# Patient Record
Sex: Female | Born: 1950 | Race: White | Hispanic: No | Marital: Married | Smoking: Never smoker
Health system: Southern US, Community
[De-identification: ages and names within clinical notes are randomized; demographics above are authoritative.]

## PROBLEM LIST (undated history)

## (undated) DIAGNOSIS — I10 Essential (primary) hypertension: Secondary | ICD-10-CM

## (undated) DIAGNOSIS — K219 Gastro-esophageal reflux disease without esophagitis: Secondary | ICD-10-CM

## (undated) HISTORY — PX: SPINE SURGERY: SHX786

## (undated) HISTORY — PX: ABDOMINAL HYSTERECTOMY: SHX81

## (undated) HISTORY — DX: Gastro-esophageal reflux disease without esophagitis: K21.9

## (undated) HISTORY — PX: BREAST BIOPSY: SHX20

## (undated) HISTORY — PX: CERVICAL CONE BIOPSY: SUR198

## (undated) HISTORY — PX: TONSILLECTOMY: SUR1361

## (undated) HISTORY — PX: CERVICAL FUSION: SHX112

## (undated) HISTORY — DX: Essential (primary) hypertension: I10

---

## 1999-07-01 ENCOUNTER — Other Ambulatory Visit: Admission: RE | Admit: 1999-07-01 | Discharge: 1999-07-01 | Payer: Self-pay | Admitting: Obstetrics and Gynecology

## 1999-07-03 ENCOUNTER — Encounter: Payer: Self-pay | Admitting: Obstetrics and Gynecology

## 1999-07-03 ENCOUNTER — Encounter: Admission: RE | Admit: 1999-07-03 | Discharge: 1999-07-03 | Payer: Self-pay | Admitting: Obstetrics and Gynecology

## 2000-07-26 ENCOUNTER — Other Ambulatory Visit: Admission: RE | Admit: 2000-07-26 | Discharge: 2000-07-26 | Payer: Self-pay | Admitting: Obstetrics and Gynecology

## 2003-04-26 ENCOUNTER — Emergency Department (HOSPITAL_COMMUNITY): Admission: EM | Admit: 2003-04-26 | Discharge: 2003-04-26 | Payer: Self-pay | Admitting: Emergency Medicine

## 2003-04-26 ENCOUNTER — Encounter: Payer: Self-pay | Admitting: Emergency Medicine

## 2004-09-08 ENCOUNTER — Ambulatory Visit: Payer: Self-pay | Admitting: Internal Medicine

## 2004-10-10 ENCOUNTER — Ambulatory Visit: Payer: Self-pay | Admitting: Internal Medicine

## 2004-11-28 ENCOUNTER — Encounter: Admission: RE | Admit: 2004-11-28 | Discharge: 2004-11-28 | Payer: Self-pay | Admitting: Gastroenterology

## 2005-02-18 ENCOUNTER — Ambulatory Visit (HOSPITAL_COMMUNITY): Admission: RE | Admit: 2005-02-18 | Discharge: 2005-02-18 | Payer: Self-pay | Admitting: Gastroenterology

## 2005-04-03 ENCOUNTER — Ambulatory Visit: Payer: Self-pay | Admitting: Internal Medicine

## 2005-07-20 ENCOUNTER — Ambulatory Visit: Payer: Self-pay | Admitting: Internal Medicine

## 2005-07-24 ENCOUNTER — Ambulatory Visit: Payer: Self-pay | Admitting: Internal Medicine

## 2005-07-29 ENCOUNTER — Ambulatory Visit: Payer: Self-pay | Admitting: *Deleted

## 2005-09-29 ENCOUNTER — Ambulatory Visit: Payer: Self-pay | Admitting: Internal Medicine

## 2006-03-19 ENCOUNTER — Ambulatory Visit: Payer: Self-pay | Admitting: Internal Medicine

## 2006-07-05 ENCOUNTER — Ambulatory Visit: Payer: Self-pay | Admitting: Internal Medicine

## 2007-08-05 ENCOUNTER — Ambulatory Visit: Payer: Self-pay | Admitting: Internal Medicine

## 2007-08-25 LAB — CONVERTED CEMR LAB: Pap Smear: NORMAL

## 2007-08-29 ENCOUNTER — Telehealth (INDEPENDENT_AMBULATORY_CARE_PROVIDER_SITE_OTHER): Payer: Self-pay | Admitting: *Deleted

## 2007-08-29 ENCOUNTER — Emergency Department (HOSPITAL_COMMUNITY): Admission: EM | Admit: 2007-08-29 | Discharge: 2007-08-29 | Payer: Self-pay | Admitting: Family Medicine

## 2007-08-31 ENCOUNTER — Telehealth: Payer: Self-pay | Admitting: Internal Medicine

## 2007-09-06 ENCOUNTER — Ambulatory Visit: Payer: Self-pay | Admitting: Internal Medicine

## 2007-09-06 DIAGNOSIS — I10 Essential (primary) hypertension: Secondary | ICD-10-CM | POA: Insufficient documentation

## 2007-09-06 DIAGNOSIS — F411 Generalized anxiety disorder: Secondary | ICD-10-CM | POA: Insufficient documentation

## 2007-12-06 ENCOUNTER — Ambulatory Visit: Payer: Self-pay | Admitting: Internal Medicine

## 2007-12-08 LAB — CONVERTED CEMR LAB
ALT: 34 units/L (ref 0–35)
AST: 32 units/L (ref 0–37)
BUN: 11 mg/dL (ref 6–23)
Basophils Absolute: 0 10*3/uL (ref 0.0–0.1)
Basophils Relative: 0.6 % (ref 0.0–1.0)
CO2: 30 meq/L (ref 19–32)
Calcium: 9.3 mg/dL (ref 8.4–10.5)
Chloride: 106 meq/L (ref 96–112)
Cholesterol: 238 mg/dL (ref 0–200)
Creatinine, Ser: 0.8 mg/dL (ref 0.4–1.2)
Direct LDL: 146.6 mg/dL
Eosinophils Absolute: 0.1 10*3/uL (ref 0.0–0.7)
Eosinophils Relative: 1.4 % (ref 0.0–5.0)
GFR calc Af Amer: 95 mL/min
GFR calc non Af Amer: 78 mL/min
Glucose, Bld: 92 mg/dL (ref 70–99)
HCT: 45.2 % (ref 36.0–46.0)
HDL: 63.5 mg/dL (ref >39.0)
Hemoglobin: 14.9 g/dL (ref 12.0–15.0)
Lymphocytes Relative: 25.5 % (ref 12.0–46.0)
MCHC: 32.8 g/dL (ref 30.0–36.0)
MCV: 91.4 fL (ref 78.0–100.0)
Monocytes Absolute: 0.4 10*3/uL (ref 0.1–1.0)
Monocytes Relative: 6.1 % (ref 3.0–12.0)
Neutro Abs: 4.4 10*3/uL (ref 1.4–7.7)
Neutrophils Relative %: 66.4 % (ref 43.0–77.0)
Platelets: 265 10*3/uL (ref 150–400)
Potassium: 4.2 meq/L (ref 3.5–5.1)
RBC: 4.95 M/uL (ref 3.87–5.11)
RDW: 12.9 % (ref 11.5–14.6)
Sodium: 141 meq/L (ref 135–145)
TSH: 1.42 microintl units/mL (ref 0.35–5.50)
Total CHOL/HDL Ratio: 3.7
Triglycerides: 114 mg/dL (ref 0–149)
VLDL: 23 mg/dL (ref 0–40)
WBC: 6.6 10*3/uL (ref 4.5–10.5)

## 2008-02-20 ENCOUNTER — Telehealth (INDEPENDENT_AMBULATORY_CARE_PROVIDER_SITE_OTHER): Payer: Self-pay | Admitting: *Deleted

## 2008-05-31 ENCOUNTER — Ambulatory Visit: Payer: Self-pay | Admitting: Internal Medicine

## 2009-07-19 ENCOUNTER — Emergency Department (HOSPITAL_COMMUNITY): Admission: EM | Admit: 2009-07-19 | Discharge: 2009-07-19 | Payer: Self-pay | Admitting: Emergency Medicine

## 2010-03-13 ENCOUNTER — Encounter: Payer: Self-pay | Admitting: Internal Medicine

## 2010-03-13 LAB — CONVERTED CEMR LAB
Calcium: 9.2 mg/dL
Glucose, Bld: 25 mg/dL
Platelets: 270 10*3/uL

## 2010-03-27 ENCOUNTER — Encounter: Payer: Self-pay | Admitting: Internal Medicine

## 2010-05-12 ENCOUNTER — Encounter: Payer: Self-pay | Admitting: Internal Medicine

## 2010-09-23 NOTE — Procedures (Signed)
Summary: Colonoscopy-- diverticuli, next 7 years  Colonoscopy/[facility]   Imported By: Lanelle Bal 05/21/2010 08:49:24  _____________________________________________________________________  External Attachment:    Type:   Image     Comment:   External Document  Appended Document: Colonoscopy-- diverticuli, next 7 years enteron EMR  Appended Document: Colonoscopy-- diverticuli, next 7 years    Clinical Lists Changes  Observations: Added new observation of COLONOSCOPY: Diverticulosis--7years (05/12/2010 9:14)       Preventive Care Screening  Colonoscopy:    Date:  05/12/2010    Results:  Diverticulosis--7years

## 2010-09-23 NOTE — Letter (Signed)
Summary: GI, planning a Cscope and EGD  [facility]   Imported By: Lanelle Bal 03/25/2010 14:25:27  _____________________________________________________________________  External Attachment:    Type:   Image     Comment:   External Document

## 2010-09-23 NOTE — Procedures (Signed)
Summary: EGD/mild gastritis, bx done   EGD/[facility]   Imported By: Lanelle Bal 05/21/2010 08:48:09  _____________________________________________________________________  External Attachment:    Type:   Image     Comment:   External Document

## 2010-09-23 NOTE — Miscellaneous (Signed)
Summary: labs  Clinical Lists Changes  Observations: Added new observation of SGPT (ALT): 18 units/L (03/13/2010 13:04) Added new observation of SGOT (AST): 21 units/L (03/13/2010 13:04) Added new observation of CALCIUM: 9.2 mg/dL (37/62/8315 17:61) Added new observation of GLUCOSE SER: 25 mg/dL (60/73/7106 26:94) Added new observation of CREATININE: 0.80 mg/dL (85/46/2703 50:09) Added new observation of POTASSIUM: 4.6 mmol/L (03/13/2010 13:04) Added new observation of PLATELETS: 270 10*3/mm3 (03/13/2010 13:04) Added new observation of HGB: 14.4 g/dL (38/18/2993 71:69) Added new observation of WBC: 7.1 10*3/mm3 (03/13/2010 13:04)

## 2010-11-26 LAB — POCT I-STAT, CHEM 8
Creatinine, Ser: 0.8 mg/dL (ref 0.4–1.2)
HCT: 45 % (ref 36.0–46.0)
Hemoglobin: 15.3 g/dL — ABNORMAL HIGH (ref 12.0–15.0)
Potassium: 4.1 mEq/L (ref 3.5–5.1)
Sodium: 143 mEq/L (ref 135–145)

## 2010-11-26 LAB — DIFFERENTIAL
Basophils Absolute: 0 10*3/uL (ref 0.0–0.1)
Basophils Relative: 1 % (ref 0–1)
Eosinophils Absolute: 0.1 10*3/uL (ref 0.0–0.7)
Neutrophils Relative %: 64 % (ref 43–77)

## 2010-11-26 LAB — CBC
MCHC: 34.1 g/dL (ref 30.0–36.0)
MCV: 90.7 fL (ref 78.0–100.0)
Platelets: 246 10*3/uL (ref 150–400)
WBC: 5.8 10*3/uL (ref 4.0–10.5)

## 2010-11-26 LAB — POCT CARDIAC MARKERS
CKMB, poc: 3.9 ng/mL (ref 1.0–8.0)
Myoglobin, poc: 101 ng/mL (ref 12–200)

## 2011-01-09 NOTE — Op Note (Signed)
Kristie Trujillo, Kristie Trujillo          ACCOUNT NO.:  [id number]   MEDICAL RECORD NO.:  [id number]          PATIENT TYPE:  AMB   LOCATION:  ENDO                         FACILITY:  [facility]   PHYSICIAN:  Anselmo Rod, M.D.  DATE OF BIRTH:  August 15, 1951   DATE OF PROCEDURE:  02/18/2005  DATE OF DISCHARGE:                                 OPERATIVE REPORT   PROCEDURE PERFORMED:  Screening colonoscopy.   ENDOSCOPIST:  Anselmo Rod, M.D.   INSTRUMENT USED:  Olympus video colonoscope.   INDICATION FOR PROCEDURE:  [age]-year-old white female undergoing a  screening colonoscopy.  The patient has a family history of colon cancer in  her paternal uncle, who died at the age of 20 of complications of the  malignancy.   PREPROCEDURE PREPARATION:  Informed consent was procured from the patient.  The patient was fasted for 8 hours prior to the procedure and prepped with a  bottle of magnesium citrate and a gallon of GoLYTELY the night prior to the  procedure.  Risks and benefits of the procedure including a 10% miss rate of  cancer in polyps was discussed with the patient.   PREPROCEDURE PHYSICAL:  VITAL SIGNS:  The patient had stable vital signs.  NECK:  Supple.  CHEST:  Chest clear to auscultation.  S1 and S2 regular.  ABDOMEN:  Abdomen soft with normal bowel sounds.   DESCRIPTION OF THE PROCEDURE:  The patient was placed in the left lateral  decubitus position and sedated with 100 mg of Demerol and 7.5 mg of Versed  in slow incremental doses.  Once the patient was adequately sedated and  maintained on low-flow oxygen and continuous cardiac monitoring, the Olympus  video colonoscope was advanced from the rectum to the cecum.  The  appendiceal orifice and ileocecal valve were clearly visualized and  photographed.  The terminal ileum appeared healthy and without lesions.  An  isolated small diverticulum was seen in the left colon at 20 cm; the rest of  the exam was unremarkable.  Small  internal hemorrhoids were appreciated on  retroflexion.  The patient tolerated the procedure well without  complications.   IMPRESSION:  1.  Small non-bleeding internal hemorrhoids.  2.  Isolated diverticulum at 20 cm.  3.  Otherwise normal exam up to the terminal ileum.  No masses or polyps      seen.   RECOMMENDATIONS:  1.  Continue a high-fiber diet with liberal fluid intake.  2.  Repeat colonoscopy in the next 5 years, unless the patient develops any      abnormal symptoms in the interim.  3.  Outpatient followup as need arises in the future.       JNM/MEDQ  D:  02/18/2005  T:  02/18/2005  Job:  [id number]   cc:   Sherry A. Rosalio Macadamia, M.D.  [address]  [place]  [place] [address]  Fax: [phone]   Wanda Plump, MD [facility]  [phone] [address] [place], [place] [address]

## 2012-03-02 ENCOUNTER — Other Ambulatory Visit: Payer: Self-pay | Admitting: Obstetrics and Gynecology

## 2012-03-02 DIAGNOSIS — Z1231 Encounter for screening mammogram for malignant neoplasm of breast: Secondary | ICD-10-CM

## 2012-03-09 ENCOUNTER — Ambulatory Visit: Payer: Self-pay

## 2012-03-15 ENCOUNTER — Ambulatory Visit
Admission: RE | Admit: 2012-03-15 | Discharge: 2012-03-15 | Disposition: A | Discharge status: Home / Self Care | Payer: BC Managed Care – PPO | Source: Ambulatory Visit | Attending: Obstetrics and Gynecology | Admitting: Obstetrics and Gynecology

## 2012-03-15 DIAGNOSIS — Z1231 Encounter for screening mammogram for malignant neoplasm of breast: Secondary | ICD-10-CM

## 2012-03-23 ENCOUNTER — Other Ambulatory Visit: Payer: Self-pay | Admitting: Obstetrics and Gynecology

## 2012-03-23 DIAGNOSIS — R928 Other abnormal and inconclusive findings on diagnostic imaging of breast: Secondary | ICD-10-CM

## 2012-03-29 ENCOUNTER — Other Ambulatory Visit: Payer: BC Managed Care – PPO

## 2012-03-29 ENCOUNTER — Ambulatory Visit
Admission: RE | Admit: 2012-03-29 | Discharge: 2012-03-29 | Disposition: A | Discharge status: Home / Self Care | Payer: BC Managed Care – PPO | Source: Ambulatory Visit | Attending: Obstetrics and Gynecology | Admitting: Obstetrics and Gynecology

## 2012-03-29 DIAGNOSIS — R928 Other abnormal and inconclusive findings on diagnostic imaging of breast: Secondary | ICD-10-CM

## 2012-08-04 ENCOUNTER — Encounter (HOSPITAL_COMMUNITY): Payer: Self-pay | Admitting: Emergency Medicine

## 2012-08-04 ENCOUNTER — Emergency Department (HOSPITAL_COMMUNITY)
Admission: EM | Admit: 2012-08-04 | Discharge: 2012-08-04 | Disposition: A | Discharge status: Home / Self Care | Payer: BC Managed Care – PPO | Attending: Emergency Medicine | Admitting: Emergency Medicine

## 2012-08-04 DIAGNOSIS — Z87891 Personal history of nicotine dependence: Secondary | ICD-10-CM | POA: Insufficient documentation

## 2012-08-04 DIAGNOSIS — H109 Unspecified conjunctivitis: Secondary | ICD-10-CM

## 2012-08-04 DIAGNOSIS — Z79899 Other long term (current) drug therapy: Secondary | ICD-10-CM | POA: Insufficient documentation

## 2012-08-04 MED ORDER — CIPROFLOXACIN HCL 0.3 % OP SOLN
1.0000 [drp] | Freq: Once | OPHTHALMIC | Status: AC
  Administered 2012-08-04: 1 [drp] via OPHTHALMIC
  Filled 2012-08-04: qty 2.5

## 2012-08-04 MED ORDER — TETRACAINE HCL 0.5 % OP SOLN
OPHTHALMIC | Status: AC
  Administered 2012-08-04: 04:00:00 via OPHTHALMIC
  Filled 2012-08-04: qty 2

## 2012-08-04 NOTE — ED Provider Notes (Signed)
History     CSN: [id number]  Arrival date & time 08/04/12  0059   First MD Initiated Contact with Patient 08/04/12 0243      Chief Complaint  Patient presents with  . Eye Problem    (Consider location/radiation/quality/duration/timing/severity/associated sxs/prior treatment) HPI Comments: 61 year old female who presents with right eye pain after using contact lenses for the first time today. She states that she had gradual onset of redness and pain in her right eye throughout the day. She tried to take the contact lens out and found there was only a small portion of it in the eye. She is concerned that there is more left. She admits to having associated redness and discharge from the eye throughout the day. She denies visual changes. The symptoms are persistent, gradually worsening.  Patient is a 61 y.o. female presenting with eye problem. The history is provided by the patient.  Eye Problem  Associated symptoms include eye redness.    History reviewed. No pertinent past medical history.  Past Surgical History  Procedure Date  . Tonsillectomy   . Cervical fusion   . Cervical cone biopsy   . Abdominal hysterectomy     No family history on file.  History  Substance Use Topics  . Smoking status: Former Games developer  . Smokeless tobacco: Not on file  . Alcohol Use: Yes     Comment: rarely    OB History    Grav Para Term Preterm Abortions TAB SAB Ect Mult Living                  Review of Systems  Constitutional: Negative for fever.  Eyes: Positive for pain and redness.    Allergies  Penicillins  Home Medications   Current Outpatient Rx  Name  Route  Sig  Dispense  Refill  . RISAQUAD PO CAPS   Oral   Take 1 capsule by mouth daily.         . ADULT MULTIVITAMIN W/MINERALS CH   Oral   Take 1 tablet by mouth daily.         . OMEGA-3-ACID ETHYL ESTERS 1 G PO CAPS   Oral   Take 2 g by mouth 2 (two) times daily.           BP 154/77  Pulse 69  Temp 97.6  F (36.4 C) (Oral)  Resp 16  Ht 5\' 7"  (1.702 m)  Wt 210 lb (95.255 kg)  BMI 32.89 kg/m2  SpO2 96%  Physical Exam  Nursing note and vitals reviewed. Constitutional: She appears well-developed and well-nourished. No distress.  HENT:  Head: Normocephalic and atraumatic.  Mouth/Throat: Oropharynx is clear and moist. No oropharyngeal exudate.       Dental Disease  Eyes: No scleral icterus.       Left eye appears normal, right eye with normal pupillary exam, normal extraocular movements. Under tetracaine and fluorescein exam there is no foreign bodies, the lid has been everted, there is no foreign bodies under the lid. She has diffuse erythema, watering and a scant amount of yellow thick purulent discharge in the corner of the eye. There is no haziness to the cornea or sclera. There is no fluorescein uptake on the surface of the eye  Neck: Normal range of motion. Neck supple. No thyromegaly present.  Pulmonary/Chest: Effort normal.  Lymphadenopathy:    She has no cervical adenopathy.  Neurological: She is alert.  Skin: Skin is warm and dry. No rash noted. She is not  diaphoretic.    ED Course  Procedures (including critical care time)  Labs Reviewed - No data to display No results found.   1. Conjunctivitis       MDM  The patient otherwise appears well, she likely has a conjunctivitis secondary to the use of her contact lenses. I have cautioned her against using this contact lenses until she follows up with the eye specialist. She has been given antibiotic drops and cautioned to use them until she follows up within one to 2 days.  Ciloxan        Vida Roller, MD 08/04/12 [phone]

## 2012-08-04 NOTE — ED Notes (Signed)
Pt attempted to remove contact from R eye tonight, unable to locate whole contact, was able to remove "peices" eye red, with yellow drainage. Painful per pt

## 2012-09-27 ENCOUNTER — Other Ambulatory Visit: Payer: Self-pay | Admitting: Obstetrics and Gynecology

## 2012-09-27 DIAGNOSIS — N63 Unspecified lump in unspecified breast: Secondary | ICD-10-CM

## 2012-10-11 ENCOUNTER — Ambulatory Visit
Admission: RE | Admit: 2012-10-11 | Discharge: 2012-10-11 | Disposition: A | Discharge status: Home / Self Care | Payer: BC Managed Care – PPO | Source: Ambulatory Visit | Attending: Internal Medicine | Admitting: Internal Medicine

## 2012-10-11 ENCOUNTER — Ambulatory Visit
Admission: RE | Admit: 2012-10-11 | Discharge: 2012-10-11 | Disposition: A | Discharge status: Home / Self Care | Payer: BC Managed Care – PPO | Source: Ambulatory Visit | Attending: Obstetrics and Gynecology | Admitting: Obstetrics and Gynecology

## 2012-10-11 ENCOUNTER — Other Ambulatory Visit: Payer: Self-pay | Admitting: Internal Medicine

## 2012-10-11 DIAGNOSIS — N63 Unspecified lump in unspecified breast: Secondary | ICD-10-CM

## 2012-10-13 ENCOUNTER — Other Ambulatory Visit: Payer: Self-pay | Admitting: Internal Medicine

## 2012-10-13 ENCOUNTER — Ambulatory Visit
Admission: RE | Admit: 2012-10-13 | Discharge: 2012-10-13 | Disposition: A | Discharge status: Home / Self Care | Payer: BC Managed Care – PPO | Source: Ambulatory Visit | Attending: Internal Medicine | Admitting: Internal Medicine

## 2012-10-13 DIAGNOSIS — N63 Unspecified lump in unspecified breast: Secondary | ICD-10-CM

## 2012-10-27 ENCOUNTER — Other Ambulatory Visit (INDEPENDENT_AMBULATORY_CARE_PROVIDER_SITE_OTHER): Payer: Self-pay | Admitting: Surgery

## 2013-02-10 ENCOUNTER — Other Ambulatory Visit: Payer: Self-pay

## 2013-02-10 DIAGNOSIS — Z1231 Encounter for screening mammogram for malignant neoplasm of breast: Secondary | ICD-10-CM

## 2013-03-20 ENCOUNTER — Ambulatory Visit
Admission: RE | Admit: 2013-03-20 | Discharge: 2013-03-20 | Disposition: A | Discharge status: Home / Self Care | Payer: BC Managed Care – PPO | Source: Ambulatory Visit

## 2013-03-20 DIAGNOSIS — Z1231 Encounter for screening mammogram for malignant neoplasm of breast: Secondary | ICD-10-CM

## 2014-02-05 ENCOUNTER — Other Ambulatory Visit: Payer: Self-pay | Admitting: Dermatology

## 2014-02-22 ENCOUNTER — Other Ambulatory Visit: Payer: Self-pay

## 2014-02-22 DIAGNOSIS — Z1231 Encounter for screening mammogram for malignant neoplasm of breast: Secondary | ICD-10-CM

## 2014-03-10 ENCOUNTER — Ambulatory Visit (INDEPENDENT_AMBULATORY_CARE_PROVIDER_SITE_OTHER): Payer: BC Managed Care – PPO | Admitting: Family Medicine

## 2014-03-10 VITALS — BP 130/76 | HR 69 | Temp 98.4°F | Resp 12 | Ht 67.5 in | Wt 217.4 lb

## 2014-03-10 DIAGNOSIS — H1132 Conjunctival hemorrhage, left eye: Secondary | ICD-10-CM

## 2014-03-10 DIAGNOSIS — H1130 Conjunctival hemorrhage, unspecified eye: Secondary | ICD-10-CM

## 2014-03-10 NOTE — Patient Instructions (Signed)
Subconjunctival Hemorrhage °A subconjunctival hemorrhage is a bright red patch covering a portion of the white of the eye. The white part of the eye is called the sclera, and it is covered by a thin membrane called the conjunctiva. This membrane is clear, except for tiny blood vessels that you can see with the naked eye. When your eye is irritated or inflamed and becomes red, it is because the vessels in the conjunctiva are swollen. °Sometimes, a blood vessel in the conjunctiva can break and bleed. When this occurs, the blood builds up between the conjunctiva and the sclera, and spreads out to create a red area. The red spot may be very small at first. It may then spread to cover a larger part of the surface of the eye, or even all of the visible white part of the eye. °In almost all cases, the blood will go away and the eye will become white again. Before completely dissolving, however, the red area may spread. It may also become brownish-yellow in color, before going away. If a lot of blood collects under the conjunctiva, it may look like a bulge on the surface of the eye. This looks scary, but it will also eventually flatten out and go away. Subconjunctival hemorrhages do not cause pain, but if swollen, may cause a feeling of irritation. There is no effect on vision.  °CAUSES  °· The most common cause is mild trauma (rubbing the eye, irritation). °· Subconjunctival hemorrhages can happen because of coughing or straining (lifting heavy objects), vomiting, or sneezing. °· In some cases, your doctor may want to check your blood pressure. High blood pressure can also cause a sunconjunctival hemorrhage. °· Severe trauma or blunt injuries. °· Diseases that affect blood clotting (hemophilia, leukemia). °· Abnormalities of blood vessels behind the eye (carotid cavernous sinus fistula). °· Tumors behind the eye. °· Certain drugs (aspirin, coumadin, heparin). °· Recent eye surgery. °HOME CARE INSTRUCTIONS  °· Do not worry  about the appearance of your eye. You may continue your usual activities. °· Often, follow-up is not necessary. °SEEK MEDICAL CARE IF:  °· Your eye becomes painful. °· The bleeding does not disappear within 3 weeks. °· Bleeding occurs elsewhere, for example, under the skin, in the mouth, or in the other eye. °· You have recurring subconjunctival hemorrhages. °SEEK IMMEDIATE MEDICAL CARE IF:  °· Your vision changes or you have difficulty seeing. °· You develop severe headache, persistent vomiting, confusion, or abnormal drowsiness (lethargy). °· Your eye seems to bulge or protrude from the eye socket. °· You notice the sudden appearance of bruises, or have spontaneous bleeding elsewhere on your body. °Document Released: 08/10/2005 Document Revised: 11/02/2011 Document Reviewed: 07/08/2009 °ExitCare® Patient Information ©2015 [employer]. This information is not intended to replace advice given to you by your health care provider. Make sure you discuss any questions you have with your health care provider. ° °

## 2014-03-10 NOTE — Progress Notes (Signed)
Subjective: This 63-year-old lady who presents with a left red eye. This started about 2 days ago. Does not know of a specific etiology. 9 and trauma. She had splash something in her eye 2 or 3 days ago, eye makeup remover. It caused a little irritation but she didn't have any concerns with it or do anything about it. She doesn't recall whether she really rubs it much at that time or not. She has been in a house which is undergoing renovation in the kitchen with chemicals smells in the air and other irritants. She has been having he now a lot, and has gained weight so was concerned about her blood pressure. She is placed for a very healthy person, has been going to a preventive health care Dr. and having numerous studies done. Other than a high CRP she is very healthy. She doesn't take much in the way of medicines, even over-the-counter, and is not on any aspirin or aspirin products. She takes some supplements.  Objective: Left conjunctiva bright red both laterally and medially. It apparently started laterally. Eyes are PERRLA. Fundi benign. Lens clear. No pain associated with this. At about the 11:00 position on the conjunctiva is a little place that looks almost like a one-vessel which could be the site where the vein popped. Otherwise she is healthy.  Assessment: Acute idiopathic sub-conjunctival hemorrhage  Plan: They should just resolve uneventfully with time. In the event of any acute concerns she is to return or go to her eye Dr.

## 2014-05-07 ENCOUNTER — Ambulatory Visit
Admission: RE | Admit: 2014-05-07 | Discharge: 2014-05-07 | Disposition: A | Discharge status: Home / Self Care | Payer: Medicare Other | Source: Ambulatory Visit

## 2014-05-07 DIAGNOSIS — Z1231 Encounter for screening mammogram for malignant neoplasm of breast: Secondary | ICD-10-CM

## 2014-12-20 ENCOUNTER — Ambulatory Visit (HOSPITAL_COMMUNITY)
Admission: RE | Admit: 2014-12-20 | Discharge: 2014-12-20 | Disposition: A | Discharge status: Home / Self Care | Payer: Medicare Other | Source: Ambulatory Visit | Attending: Chiropractic Medicine | Admitting: Chiropractic Medicine

## 2014-12-20 ENCOUNTER — Other Ambulatory Visit (HOSPITAL_COMMUNITY): Payer: Self-pay | Admitting: Chiropractic Medicine

## 2014-12-20 DIAGNOSIS — W208XXA Other cause of strike by thrown, projected or falling object, initial encounter: Secondary | ICD-10-CM | POA: Insufficient documentation

## 2014-12-20 DIAGNOSIS — S99922A Unspecified injury of left foot, initial encounter: Secondary | ICD-10-CM | POA: Insufficient documentation

## 2015-03-27 ENCOUNTER — Other Ambulatory Visit: Payer: Self-pay | Admitting: Dermatology

## 2015-04-15 ENCOUNTER — Other Ambulatory Visit: Payer: Self-pay

## 2015-04-15 DIAGNOSIS — Z1231 Encounter for screening mammogram for malignant neoplasm of breast: Secondary | ICD-10-CM

## 2015-05-10 ENCOUNTER — Ambulatory Visit
Admission: RE | Admit: 2015-05-10 | Discharge: 2015-05-10 | Disposition: A | Discharge status: Home / Self Care | Payer: Medicare Other | Source: Ambulatory Visit

## 2015-05-10 DIAGNOSIS — Z1231 Encounter for screening mammogram for malignant neoplasm of breast: Secondary | ICD-10-CM

## 2016-04-16 ENCOUNTER — Other Ambulatory Visit: Payer: Self-pay | Admitting: Family Medicine

## 2016-04-16 DIAGNOSIS — Z1231 Encounter for screening mammogram for malignant neoplasm of breast: Secondary | ICD-10-CM

## 2016-05-12 ENCOUNTER — Ambulatory Visit
Admission: RE | Admit: 2016-05-12 | Discharge: 2016-05-12 | Disposition: A | Discharge status: Home / Self Care | Payer: Medicare Other | Source: Ambulatory Visit | Attending: Family Medicine | Admitting: Family Medicine

## 2016-05-12 DIAGNOSIS — Z1231 Encounter for screening mammogram for malignant neoplasm of breast: Secondary | ICD-10-CM

## 2016-09-03 ENCOUNTER — Other Ambulatory Visit: Payer: Self-pay | Admitting: Obstetrics and Gynecology

## 2016-09-03 DIAGNOSIS — R5381 Other malaise: Secondary | ICD-10-CM

## 2016-09-03 DIAGNOSIS — E2839 Other primary ovarian failure: Secondary | ICD-10-CM

## 2016-09-08 ENCOUNTER — Ambulatory Visit
Admission: RE | Admit: 2016-09-08 | Discharge: 2016-09-08 | Disposition: A | Discharge status: Home / Self Care | Payer: Medicare Other | Source: Ambulatory Visit | Attending: Obstetrics and Gynecology | Admitting: Obstetrics and Gynecology

## 2016-09-08 DIAGNOSIS — R5381 Other malaise: Secondary | ICD-10-CM

## 2017-02-19 ENCOUNTER — Ambulatory Visit (INDEPENDENT_AMBULATORY_CARE_PROVIDER_SITE_OTHER): Payer: Medicare Other | Admitting: Podiatry

## 2017-02-19 ENCOUNTER — Ambulatory Visit (INDEPENDENT_AMBULATORY_CARE_PROVIDER_SITE_OTHER): Payer: Medicare Other

## 2017-02-19 ENCOUNTER — Encounter: Payer: Self-pay | Admitting: Podiatry

## 2017-02-19 ENCOUNTER — Other Ambulatory Visit: Payer: Self-pay | Admitting: Podiatry

## 2017-02-19 DIAGNOSIS — M722 Plantar fascial fibromatosis: Secondary | ICD-10-CM

## 2017-02-19 DIAGNOSIS — M79672 Pain in left foot: Principal | ICD-10-CM

## 2017-02-19 DIAGNOSIS — M79671 Pain in right foot: Secondary | ICD-10-CM

## 2017-02-19 NOTE — Progress Notes (Signed)
   Subjective:    Patient ID: Kristie Trujillo, female    DOB: 1950/08/27, 66 y.o.   MRN: [id number]  HPI 66 year old female presents the office today requesting orthotics. She states that she had orthotics are several years old at this point. He does walk 5 times a week. She states that she has a history of Achilles tendinitis as well. She states that she has recently been diagnosed plantar fasciitis and she does not want to take any medication for this and she does not with steroid injection. He feels that she can control his changes shoes, stretching that she's been doing home as well as new orthotics. She denies any recent injury or trauma. She denies any numbness or tingling. Pain is worse when she sits down for some time and stands backup or she's been on her feet all day. The pain does not wake her up at night. She has no other complaints at this time.   Review of Systems  All other systems reviewed and are negative.      Objective:   Physical Exam General: AAO x3, NAD  Dermatological: Skin is warm, dry and supple bilateral. Nails x 10 are well manicured; remaining integument appears unremarkable at this time. There are no open sores, no preulcerative lesions, no rash or signs of infection present.  Vascular: Dorsalis Pedis artery and Posterior Tibial artery pedal pulses are 2/4 bilateral with immedate capillary fill time.  There is no pain with calf compression, swelling, warmth, erythema.   Neruologic: Grossly intact via light touch bilateral. Vibratory intact via tuning fork bilateral. Protective threshold with Semmes Wienstein monofilament intact to all pedal sites bilateral.   Musculoskeletal: There is mild tenderness to palpation along the plantar medial tubercle of the calcaneus at the insertion of plantar fascia on the right and left foot. There is no pain along the course of the plantar fascia within the arch of the foot. Plantar fascia appears to be intact. There is no pain  with lateral compression of the calcaneus or pain with vibratory sensation. There is no pain along the course or insertion of the achilles tendon. No other areas of tenderness to bilateral lower extremities. Muscular strength 5/5 in all groups tested bilateral. Equinus is present.   Gait: Unassisted, Nonantalgic.     Assessment & Plan:  66 year old female with bilateral plantar fasciitis, requesting orthotics -Treatment options discussed including all alternatives, risks, and complications -Etiology of symptoms were discussed -X-rays were obtained and reviewed with the patient. No evidence of acute fracture identified. -She was molded orthotics and they were sent to [facility] labs.  -Continue stretching, icing exercises daily -Ice and elevation -Changes shoes discussed -Follow-up in 3 weeks for orthotics or sooner if needed.  Celesta Gentile, DPM

## 2017-04-01 ENCOUNTER — Other Ambulatory Visit: Payer: Self-pay | Admitting: Family Medicine

## 2017-04-01 DIAGNOSIS — Z1231 Encounter for screening mammogram for malignant neoplasm of breast: Secondary | ICD-10-CM

## 2017-04-08 ENCOUNTER — Ambulatory Visit: Payer: Medicare Other | Admitting: Orthotics

## 2017-04-08 DIAGNOSIS — M722 Plantar fascial fibromatosis: Secondary | ICD-10-CM

## 2017-04-20 NOTE — Progress Notes (Signed)
Patient came in today to pick up custom made foot orthotics.  The goals were accomplished and the patient reported no dissatisfaction with said orthotics.  Patient was advised of breakin period and how to report any issues. 

## 2017-05-24 ENCOUNTER — Ambulatory Visit: Payer: Medicare Other | Admitting: Orthotics

## 2017-05-24 DIAGNOSIS — M722 Plantar fascial fibromatosis: Secondary | ICD-10-CM

## 2017-05-24 NOTE — Progress Notes (Signed)
Patient came in for minor adjustment of F/O...small amount of material was removed from medial aspect of 1st met head.  Patient told to f/up in two weeks.

## 2017-05-25 ENCOUNTER — Ambulatory Visit
Admission: RE | Admit: 2017-05-25 | Discharge: 2017-05-25 | Disposition: A | Discharge status: Home / Self Care | Payer: Medicare Other | Source: Ambulatory Visit | Attending: Family Medicine | Admitting: Family Medicine

## 2017-05-25 ENCOUNTER — Other Ambulatory Visit: Payer: Self-pay | Admitting: Family Medicine

## 2017-05-25 DIAGNOSIS — Z1231 Encounter for screening mammogram for malignant neoplasm of breast: Secondary | ICD-10-CM

## 2017-06-17 ENCOUNTER — Ambulatory Visit (INDEPENDENT_AMBULATORY_CARE_PROVIDER_SITE_OTHER): Payer: Medicare Other | Admitting: Orthotics

## 2017-06-17 DIAGNOSIS — M722 Plantar fascial fibromatosis: Secondary | ICD-10-CM

## 2017-06-17 NOTE — Progress Notes (Signed)
Orthotics to be sent back to [facility] for following modifications:  First met cutout, p-cell cover with 1.5 mm ppt padding.

## 2017-06-30 ENCOUNTER — Other Ambulatory Visit: Payer: Medicare Other | Admitting: Orthotics

## 2017-07-01 ENCOUNTER — Other Ambulatory Visit: Payer: Medicare Other | Admitting: Orthotics

## 2017-07-02 ENCOUNTER — Telehealth: Payer: Self-pay | Admitting: Podiatry

## 2017-07-02 NOTE — Telephone Encounter (Signed)
Corrected orthotics in.The patient left the office before the visit was finished. Message for pt to call to schedule appt to pick them up.

## 2017-07-13 ENCOUNTER — Ambulatory Visit: Payer: Medicare Other | Admitting: Orthotics

## 2017-07-13 DIAGNOSIS — M722 Plantar fascial fibromatosis: Secondary | ICD-10-CM

## 2017-12-27 ENCOUNTER — Other Ambulatory Visit: Payer: Self-pay | Admitting: Dermatology

## 2018-02-17 NOTE — Progress Notes (Signed)
Patient came in today to pick up custom made foot orthotics.  The goals were accomplished and the patient reported no dissatisfaction with said orthotics.  Patient was advised of breakin period and how to report any issues. 

## 2018-04-22 ENCOUNTER — Other Ambulatory Visit: Payer: Self-pay | Admitting: Family Medicine

## 2018-04-22 DIAGNOSIS — Z1231 Encounter for screening mammogram for malignant neoplasm of breast: Secondary | ICD-10-CM

## 2018-05-27 ENCOUNTER — Ambulatory Visit
Admission: RE | Admit: 2018-05-27 | Discharge: 2018-05-27 | Disposition: A | Discharge status: Home / Self Care | Payer: Medicare Other | Source: Ambulatory Visit | Attending: Family Medicine | Admitting: Family Medicine

## 2018-05-27 DIAGNOSIS — Z1231 Encounter for screening mammogram for malignant neoplasm of breast: Secondary | ICD-10-CM

## 2019-04-20 ENCOUNTER — Other Ambulatory Visit: Payer: Self-pay | Admitting: Family Medicine

## 2019-04-20 DIAGNOSIS — Z1231 Encounter for screening mammogram for malignant neoplasm of breast: Secondary | ICD-10-CM

## 2019-06-02 ENCOUNTER — Ambulatory Visit
Admission: RE | Admit: 2019-06-02 | Discharge: 2019-06-02 | Disposition: A | Discharge status: Home / Self Care | Payer: Medicare Other | Source: Ambulatory Visit | Attending: Family Medicine | Admitting: Family Medicine

## 2019-06-02 DIAGNOSIS — Z1231 Encounter for screening mammogram for malignant neoplasm of breast: Secondary | ICD-10-CM

## 2019-10-02 ENCOUNTER — Ambulatory Visit: Payer: Medicare PPO

## 2019-10-21 ENCOUNTER — Ambulatory Visit: Payer: Medicare Other

## 2020-05-10 ENCOUNTER — Other Ambulatory Visit: Payer: Self-pay | Admitting: Family Medicine

## 2020-05-10 DIAGNOSIS — Z1231 Encounter for screening mammogram for malignant neoplasm of breast: Secondary | ICD-10-CM

## 2020-06-03 ENCOUNTER — Ambulatory Visit: Payer: Medicare PPO

## 2020-07-15 ENCOUNTER — Other Ambulatory Visit: Payer: Self-pay

## 2020-07-15 ENCOUNTER — Ambulatory Visit
Admission: RE | Admit: 2020-07-15 | Discharge: 2020-07-15 | Disposition: A | Discharge status: Home / Self Care | Payer: Medicare PPO | Source: Ambulatory Visit | Attending: Family Medicine | Admitting: Family Medicine

## 2020-07-15 DIAGNOSIS — Z1231 Encounter for screening mammogram for malignant neoplasm of breast: Secondary | ICD-10-CM

## 2021-06-05 ENCOUNTER — Other Ambulatory Visit: Payer: Self-pay | Admitting: Obstetrics and Gynecology

## 2021-06-05 DIAGNOSIS — Z1231 Encounter for screening mammogram for malignant neoplasm of breast: Secondary | ICD-10-CM

## 2021-07-16 ENCOUNTER — Ambulatory Visit: Payer: Medicare PPO

## 2021-07-28 ENCOUNTER — Ambulatory Visit
Admission: RE | Admit: 2021-07-28 | Discharge: 2021-07-28 | Disposition: A | Discharge status: Home / Self Care | Payer: Medicare PPO | Source: Ambulatory Visit | Attending: Obstetrics and Gynecology | Admitting: Obstetrics and Gynecology

## 2021-07-28 DIAGNOSIS — Z1231 Encounter for screening mammogram for malignant neoplasm of breast: Secondary | ICD-10-CM

## 2021-11-11 ENCOUNTER — Encounter: Payer: Self-pay | Admitting: Physician Assistant

## 2021-11-11 ENCOUNTER — Other Ambulatory Visit: Payer: Self-pay

## 2021-11-11 ENCOUNTER — Ambulatory Visit: Payer: Medicare PPO | Admitting: Physician Assistant

## 2021-11-11 ENCOUNTER — Other Ambulatory Visit: Payer: Self-pay | Admitting: Physician Assistant

## 2021-11-11 DIAGNOSIS — L820 Inflamed seborrheic keratosis: Secondary | ICD-10-CM

## 2021-11-11 DIAGNOSIS — L821 Other seborrheic keratosis: Secondary | ICD-10-CM | POA: Diagnosis not present

## 2021-11-11 DIAGNOSIS — D485 Neoplasm of uncertain behavior of skin: Secondary | ICD-10-CM

## 2021-11-11 NOTE — Progress Notes (Signed)
? ?  Follow-Up Visit ?  ?Subjective  ?Kristie Trujillo is a 71 y.o. female who presents for the following: Skin Problem (Patient has lesion on right chest/collar bone area. Gotten bigger per patient. Very irritated. No history of skin cancers. No family history of skin cancer. ). ? ? ?The following portions of the chart were reviewed this encounter and updated as appropriate:  Tobacco  Allergies  Meds  Problems  Med Hx  Surg Hx  Fam Hx   ?  ? ?Objective  ?Well appearing patient in no apparent distress; mood and affect are within normal limits. ? ?All skin waist up examined. ? ?Chest - Medial [facility]), Generalized, Mid Back ?Stuck-on, crusted plaques.  ? ?Right Breast ?Pink plaque ? ? ? ? ? ? ? ?Assessment & Plan  ?Neoplasm of uncertain behavior of skin ?Right Breast ? ?Skin / nail biopsy ?Type of biopsy: tangential   ?Informed consent: discussed and consent obtained   ?Timeout: patient name, date of birth, surgical site, and procedure verified   ?Anesthesia: the lesion was anesthetized in a standard fashion   ?Anesthetic:  1% lidocaine w/ epinephrine 1-100,000 local infiltration ?Instrument used: flexible razor blade   ?Hemostasis achieved with: ferric subsulfate   ?Outcome: patient tolerated procedure well   ?Post-procedure details: wound care instructions given   ? ?Specimen 1 - Surgical pathology ?Differential Diagnosis: scc vs bcc, CN ? ?Check Margins: No ? ?Seborrheic keratosis (3) ?Chest - Medial [facility]); Mid Back; Generalized ? ?Benign-no treatment if stable ? ? ? ?I, Milissa Fesperman, PA-C, have reviewed all documentation's for this visit.  The documentation on 11/11/21 for the exam, diagnosis, procedures and orders are all accurate and complete. ?

## 2021-11-11 NOTE — Patient Instructions (Signed)

## 2021-11-18 ENCOUNTER — Telehealth: Payer: Self-pay | Admitting: Physician Assistant

## 2021-11-18 NOTE — Telephone Encounter (Signed)
Path to patient. No follow up needed.  

## 2021-11-18 NOTE — Telephone Encounter (Signed)
Patient left message on office voice mail that she was calling for pathology results from last visit with Kelli Sheffield, PA-C. 

## 2021-12-12 ENCOUNTER — Emergency Department (HOSPITAL_COMMUNITY): Payer: Medicare PPO

## 2021-12-12 ENCOUNTER — Observation Stay (HOSPITAL_COMMUNITY)
Admission: EM | Admit: 2021-12-12 | Discharge: 2021-12-13 | Disposition: A | Discharge status: Home / Self Care | Payer: Medicare PPO | Attending: Emergency Medicine | Admitting: Emergency Medicine

## 2021-12-12 DIAGNOSIS — Y92410 Unspecified street and highway as the place of occurrence of the external cause: Secondary | ICD-10-CM | POA: Diagnosis not present

## 2021-12-12 DIAGNOSIS — I700 Atherosclerosis of aorta: Secondary | ICD-10-CM | POA: Insufficient documentation

## 2021-12-12 DIAGNOSIS — Z131 Encounter for screening for diabetes mellitus: Secondary | ICD-10-CM | POA: Insufficient documentation

## 2021-12-12 DIAGNOSIS — E041 Nontoxic single thyroid nodule: Secondary | ICD-10-CM | POA: Diagnosis not present

## 2021-12-12 DIAGNOSIS — S52501A Unspecified fracture of the lower end of right radius, initial encounter for closed fracture: Secondary | ICD-10-CM | POA: Diagnosis not present

## 2021-12-12 DIAGNOSIS — R4182 Altered mental status, unspecified: Secondary | ICD-10-CM | POA: Diagnosis present

## 2021-12-12 DIAGNOSIS — S00511A Abrasion of lip, initial encounter: Secondary | ICD-10-CM | POA: Insufficient documentation

## 2021-12-12 DIAGNOSIS — Z20822 Contact with and (suspected) exposure to covid-19: Secondary | ICD-10-CM | POA: Diagnosis not present

## 2021-12-12 DIAGNOSIS — R2681 Unsteadiness on feet: Secondary | ICD-10-CM | POA: Diagnosis not present

## 2021-12-12 DIAGNOSIS — I10 Essential (primary) hypertension: Secondary | ICD-10-CM | POA: Insufficient documentation

## 2021-12-12 DIAGNOSIS — S0031XA Abrasion of nose, initial encounter: Secondary | ICD-10-CM | POA: Diagnosis not present

## 2021-12-12 DIAGNOSIS — S060XAA Concussion with loss of consciousness status unknown, initial encounter: Principal | ICD-10-CM | POA: Diagnosis present

## 2021-12-12 DIAGNOSIS — S52502A Unspecified fracture of the lower end of left radius, initial encounter for closed fracture: Secondary | ICD-10-CM | POA: Insufficient documentation

## 2021-12-12 DIAGNOSIS — K5790 Diverticulosis of intestine, part unspecified, without perforation or abscess without bleeding: Secondary | ICD-10-CM | POA: Diagnosis not present

## 2021-12-12 DIAGNOSIS — M6281 Muscle weakness (generalized): Secondary | ICD-10-CM | POA: Diagnosis not present

## 2021-12-12 DIAGNOSIS — Z91040 Latex allergy status: Secondary | ICD-10-CM | POA: Diagnosis not present

## 2021-12-12 DIAGNOSIS — Z23 Encounter for immunization: Secondary | ICD-10-CM | POA: Insufficient documentation

## 2021-12-12 DIAGNOSIS — S0990XA Unspecified injury of head, initial encounter: Secondary | ICD-10-CM | POA: Diagnosis present

## 2021-12-12 LAB — CBC
HCT: 39.4 % (ref 36.0–46.0)
Hemoglobin: 13.3 g/dL (ref 12.0–15.0)
MCH: 30.4 pg (ref 26.0–34.0)
MCHC: 33.8 g/dL (ref 30.0–36.0)
MCV: 90 fL (ref 80.0–100.0)
Platelets: 237 10*3/uL (ref 150–400)
RBC: 4.38 MIL/uL (ref 3.87–5.11)
RDW: 13 % (ref 11.5–15.5)
WBC: 7.2 10*3/uL (ref 4.0–10.5)
nRBC: 0 % (ref 0.0–0.2)

## 2021-12-12 LAB — COMPREHENSIVE METABOLIC PANEL
ALT: 25 U/L (ref 0–44)
AST: 50 U/L — ABNORMAL HIGH (ref 15–41)
Albumin: 3.8 g/dL (ref 3.5–5.0)
Alkaline Phosphatase: 87 U/L (ref 38–126)
Anion gap: 7 (ref 5–15)
BUN: 19 mg/dL (ref 8–23)
CO2: 24 mmol/L (ref 22–32)
Calcium: 9 mg/dL (ref 8.9–10.3)
Chloride: 109 mmol/L (ref 98–111)
Creatinine, Ser: 0.9 mg/dL (ref 0.44–1.00)
GFR, Estimated: >60 mL/min (ref >60)
Glucose, Bld: 113 mg/dL — ABNORMAL HIGH (ref 70–99)
Potassium: 4.1 mmol/L (ref 3.5–5.1)
Sodium: 140 mmol/L (ref 135–145)
Total Bilirubin: 0.7 mg/dL (ref 0.3–1.2)
Total Protein: 6.3 g/dL — ABNORMAL LOW (ref 6.5–8.1)

## 2021-12-12 LAB — SAMPLE TO BLOOD BANK

## 2021-12-12 LAB — RESP PANEL BY RT-PCR (FLU A&B, COVID) ARPGX2
Influenza A by PCR: NEGATIVE
Influenza B by PCR: NEGATIVE
SARS Coronavirus 2 by RT PCR: NEGATIVE

## 2021-12-12 LAB — I-STAT CHEM 8, ED
BUN: 21 mg/dL (ref 8–23)
Calcium, Ion: 1.15 mmol/L (ref 1.15–1.40)
Chloride: 105 mmol/L (ref 98–111)
Creatinine, Ser: 0.8 mg/dL (ref 0.44–1.00)
Glucose, Bld: 107 mg/dL — ABNORMAL HIGH (ref 70–99)
HCT: 40 % (ref 36.0–46.0)
Hemoglobin: 13.6 g/dL (ref 12.0–15.0)
Potassium: 3.9 mmol/L (ref 3.5–5.1)
Sodium: 142 mmol/L (ref 135–145)
TCO2: 26 mmol/L (ref 22–32)

## 2021-12-12 LAB — LACTIC ACID, PLASMA: Lactic Acid, Venous: 1.7 mmol/L (ref 0.5–1.9)

## 2021-12-12 LAB — ETHANOL: Alcohol, Ethyl (B): <10 mg/dL (ref <10)

## 2021-12-12 LAB — PROTIME-INR
INR: 0.9 (ref 0.8–1.2)
Prothrombin Time: 11.9 seconds (ref 11.4–15.2)

## 2021-12-12 LAB — CBG MONITORING, ED: Glucose-Capillary: 90 mg/dL (ref 70–99)

## 2021-12-12 MED ORDER — PROPOFOL 10 MG/ML IV BOLUS
0.5000 mg/kg | Freq: Once | INTRAVENOUS | Status: DC
  Filled 2021-12-12: qty 20

## 2021-12-12 MED ORDER — SODIUM CHLORIDE 0.9 % IV BOLUS
1000.0000 mL | Freq: Once | INTRAVENOUS | Status: AC
  Administered 2021-12-12: 1000 mL via INTRAVENOUS

## 2021-12-12 MED ORDER — PROPOFOL 10 MG/ML IV BOLUS
INTRAVENOUS | Status: AC | PRN
Start: 2021-12-12 — End: 2021-12-12
  Administered 2021-12-12: 20 mg via INTRAVENOUS
  Administered 2021-12-12 (×2): 30 mg via INTRAVENOUS

## 2021-12-12 MED ORDER — IOHEXOL 300 MG/ML  SOLN
100.0000 mL | Freq: Once | INTRAMUSCULAR | Status: AC | PRN
  Administered 2021-12-12: 100 mL via INTRAVENOUS

## 2021-12-12 MED ORDER — TETANUS-DIPHTH-ACELL PERTUSSIS 5-2.5-18.5 LF-MCG/0.5 IM SUSY
0.5000 mL | PREFILLED_SYRINGE | Freq: Once | INTRAMUSCULAR | Status: AC
  Administered 2021-12-12: 0.5 mL via INTRAMUSCULAR
  Filled 2021-12-12: qty 0.5

## 2021-12-12 MED ORDER — FENTANYL CITRATE PF 50 MCG/ML IJ SOSY
50.0000 ug | PREFILLED_SYRINGE | Freq: Once | INTRAMUSCULAR | Status: AC
  Administered 2021-12-12: 50 ug via INTRAVENOUS
  Filled 2021-12-12: qty 1

## 2021-12-12 NOTE — ED Notes (Signed)
Pt transported to CT ?

## 2021-12-12 NOTE — ED Notes (Signed)
Family at bedside at this time

## 2021-12-12 NOTE — Progress Notes (Signed)
?   12/12/21 2030  ?Clinical Encounter Type  ?Visited With Patient not available;Health care provider  ?Visit Type ED;Trauma;Initial  ?Referral From Nurse  ?Consult/Referral To Chaplain ?[facility] [place])  ? ?Responded to page in [facility] Room 33 for Level 2 Trauma. Patient being evaluated and treated by medical staff at this time, patient not seen by Chaplain. No family present at this time. Staff will page Chaplain upon request of patient or family.  ?Chaplain Ryheem Jay, M.Min., [phone].   ?
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
## 2021-12-12 NOTE — Progress Notes (Signed)
RRT at bedside for the procedural conscious sedation of left wrist deformity. Wrist Reduction done per MD Regenia Skeeter. Patient has been clinically stable throughout the procedure no respiratory compromise or any apparent complications noted. Hemodynamic parameters are stable.  ? ?Karson Reede L. Tamala Julian, BS, RRT-ACCS, RCP ?

## 2021-12-12 NOTE — ED Notes (Signed)
RT and Ortho notified that the patient is ready for her procedure ?
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
## 2021-12-12 NOTE — Progress Notes (Signed)
Orthopedic Tech Progress Note ?Patient Details:  ?Kristie Trujillo ?1950-11-20 ?[id number] ? ?Patient ID: Kristie Trujillo, female   DOB: 04-Oct-1950, 71 y.o.   MRN: [id number] ?Level 2 trauma not needed. ?[name] ?12/12/2021, 8:54 PM ? ?

## 2021-12-12 NOTE — ED Notes (Signed)
Patient verbalizes understanding of the need for a urine sample ?

## 2021-12-12 NOTE — Progress Notes (Signed)
Orthopedic Tech Progress Note ?Patient Details:  ?Kristie Trujillo ?09-07-1950 ?[id number] ? ?Ortho Devices ?Type of Ortho Device: Sugartong splint, Volar splint ?Ortho Device/Splint Location: lue,rue ?Ortho Device/Splint Interventions: Ordered, Application, Adjustment ? Assisted ED DR with splints. ?Post Interventions ?Patient Tolerated: Well ? ?[name] ?12/12/2021, 11:56 PM ? ?

## 2021-12-12 NOTE — ED Notes (Signed)
Ortho provider at bedside at this time ?
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
## 2021-12-13 ENCOUNTER — Telehealth (HOSPITAL_COMMUNITY): Payer: Self-pay | Admitting: Medical

## 2021-12-13 DIAGNOSIS — S060XAA Concussion with loss of consciousness status unknown, initial encounter: Secondary | ICD-10-CM | POA: Diagnosis present

## 2021-12-13 MED ORDER — ACETAMINOPHEN 500 MG PO TABS
1000.0000 mg | ORAL_TABLET | Freq: Four times a day (QID) | ORAL | Status: DC
  Administered 2021-12-13 (×2): 1000 mg via ORAL
  Filled 2021-12-13 (×2): qty 2

## 2021-12-13 MED ORDER — DOCUSATE SODIUM 100 MG PO CAPS
100.0000 mg | ORAL_CAPSULE | Freq: Two times a day (BID) | ORAL | Status: DC
  Administered 2021-12-13 (×2): 100 mg via ORAL
  Filled 2021-12-13 (×2): qty 1

## 2021-12-13 MED ORDER — ACETAMINOPHEN 500 MG PO TABS: 1000.0000 mg | ORAL_TABLET | Freq: Four times a day (QID) | ORAL | Status: AC | PRN

## 2021-12-13 MED ORDER — IBUPROFEN 600 MG PO TABS: 600.0000 mg | ORAL_TABLET | Freq: Three times a day (TID) | ORAL | 0 refills | Status: AC

## 2021-12-13 MED ORDER — METHOCARBAMOL 500 MG PO TABS: 500.0000 mg | ORAL_TABLET | Freq: Three times a day (TID) | ORAL | 0 refills | Status: AC | PRN

## 2021-12-13 MED ORDER — TRAMADOL HCL 50 MG PO TABS: 50.0000 mg | ORAL_TABLET | Freq: Four times a day (QID) | ORAL | 0 refills | Status: AC | PRN

## 2021-12-13 MED ORDER — IBUPROFEN 400 MG PO TABS
600.0000 mg | ORAL_TABLET | Freq: Three times a day (TID) | ORAL | Status: DC
  Administered 2021-12-13 (×3): 600 mg via ORAL
  Filled 2021-12-13 (×3): qty 1

## 2021-12-13 MED ORDER — ONDANSETRON 4 MG PO TBDP
4.0000 mg | ORAL_TABLET | Freq: Four times a day (QID) | ORAL | Status: DC | PRN
Start: 2021-12-13 — End: 2021-12-13

## 2021-12-13 MED ORDER — BACITRACIN ZINC 500 UNIT/GM EX OINT
TOPICAL_OINTMENT | Freq: Every day | CUTANEOUS | 0 refills | Status: AC
Start: 2021-12-13 — End: ?

## 2021-12-13 MED ORDER — LACTATED RINGERS IV SOLN: INTRAVENOUS | Status: DC

## 2021-12-13 MED ORDER — ONDANSETRON HCL 4 MG/2ML IJ SOLN
4.0000 mg | Freq: Four times a day (QID) | INTRAMUSCULAR | Status: DC | PRN
Start: 2021-12-13 — End: 2021-12-13

## 2021-12-13 MED ORDER — TRAMADOL HCL 50 MG PO TABS
50.0000 mg | ORAL_TABLET | Freq: Four times a day (QID) | ORAL | Status: DC | PRN
Start: 2021-12-13 — End: 2021-12-13

## 2021-12-13 MED ORDER — BACITRACIN ZINC 500 UNIT/GM EX OINT
TOPICAL_OINTMENT | Freq: Every day | CUTANEOUS | Status: DC
  Administered 2021-12-13: 1 via TOPICAL
  Filled 2021-12-13: qty 0.9

## 2021-12-13 MED ORDER — TRAMADOL HCL 50 MG PO TABS: 50.0000 mg | ORAL_TABLET | Freq: Four times a day (QID) | ORAL | 0 refills | Status: DC | PRN

## 2021-12-13 MED ORDER — ENOXAPARIN SODIUM 30 MG/0.3ML IJ SOSY: 30.0000 mg | PREFILLED_SYRINGE | Freq: Two times a day (BID) | INTRAMUSCULAR | Status: DC

## 2021-12-13 MED ORDER — METHOCARBAMOL 500 MG PO TABS
750.0000 mg | ORAL_TABLET | Freq: Three times a day (TID) | ORAL | Status: DC
  Administered 2021-12-13 (×3): 750 mg via ORAL
  Filled 2021-12-13 (×3): qty 2
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
## 2021-12-13 NOTE — ED Notes (Signed)
Breakfast order placed ?
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
## 2021-12-13 NOTE — Telephone Encounter (Signed)
Pt presented to the ED waiting room. She was discharged earlier this morning. She did not go try to pick up her Tramadol Rx until after the pharmacy was closed. Requesting it be sent to 24/7 pharmacy. PDMP reviewed; does not appear pt picked up Rx yet. Sent to pharmacy of her choosing.  ?

## 2021-12-13 NOTE — ED Notes (Signed)
Patient and the patient's family member made aware of the current plan of care. Both verbalize understanding of the plan of care at this time ?
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
## 2021-12-13 NOTE — ED Notes (Signed)
Kristie Trujillo Patient's daughter [phone] ?
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
## 2022-04-02 ENCOUNTER — Other Ambulatory Visit: Payer: Self-pay | Admitting: Family Medicine

## 2022-04-02 DIAGNOSIS — E2839 Other primary ovarian failure: Secondary | ICD-10-CM

## 2022-06-26 ENCOUNTER — Other Ambulatory Visit: Payer: Self-pay | Admitting: Family Medicine

## 2022-06-26 DIAGNOSIS — Z1231 Encounter for screening mammogram for malignant neoplasm of breast: Secondary | ICD-10-CM

## 2022-08-25 ENCOUNTER — Ambulatory Visit
Admission: RE | Admit: 2022-08-25 | Discharge: 2022-08-25 | Disposition: A | Discharge status: Home / Self Care | Payer: Medicare PPO | Source: Ambulatory Visit | Attending: Family Medicine | Admitting: Family Medicine

## 2022-08-25 DIAGNOSIS — Z1231 Encounter for screening mammogram for malignant neoplasm of breast: Secondary | ICD-10-CM

## 2022-11-17 ENCOUNTER — Ambulatory Visit: Payer: Medicare PPO | Admitting: Physician Assistant

## 2022-11-17 ENCOUNTER — Ambulatory Visit: Payer: Medicare PPO | Admitting: Dermatology

## 2022-12-08 IMAGING — CT CT MAXILLOFACIAL W/O CM
3 of 6 series · 12 of 47 positions shown, 14 images · IV contrast (agent unspecified)
Comparison: None.

CLINICAL DATA: Blunt polytrauma.

EXAM:
CT HEAD WITHOUT CONTRAST
CT MAXILLOFACIAL WITHOUT CONTRAST
CT CERVICAL SPINE WITHOUT CONTRAST
CT CHEST, ABDOMEN AND PELVIS WITH CONTRAST
TECHNIQUE: Contiguous axial images were obtained from the base of the skull
through the vertex without intravenous contrast.

[Series 3: maxilllofacial 2.0 hr40 3 · axial · 0.38mm/px · z∈[+1059,+1199]mm · 7 of 82 slices shown, 9 images]
[im 6/82  brain]
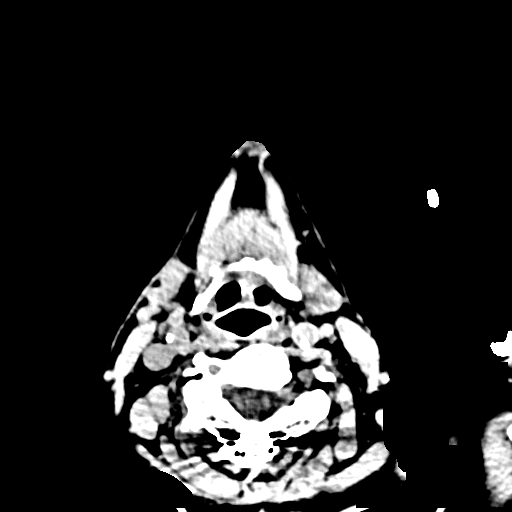
[im 6/82  bone]
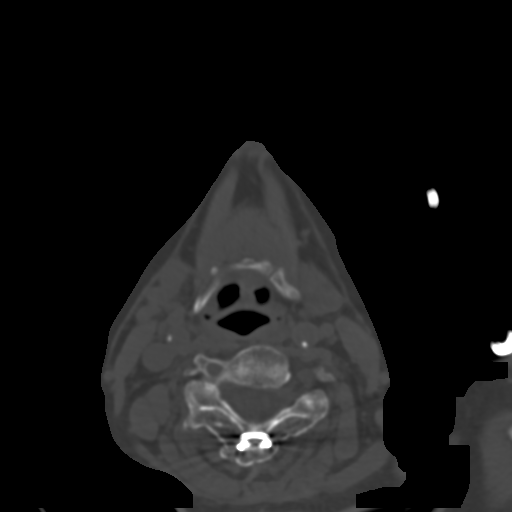
[im 18/82  bone]
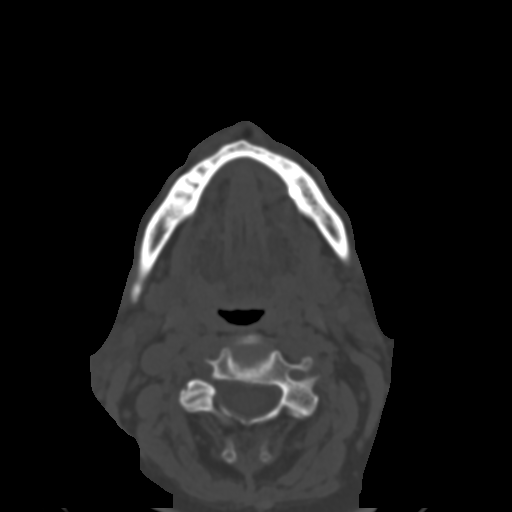
[im 29/82  bone]
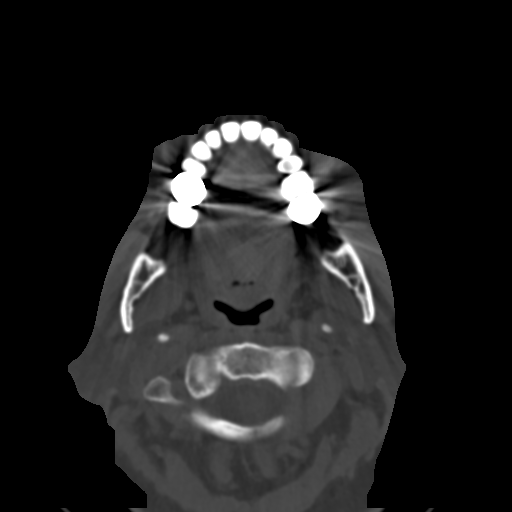
[im 41/82  bone]
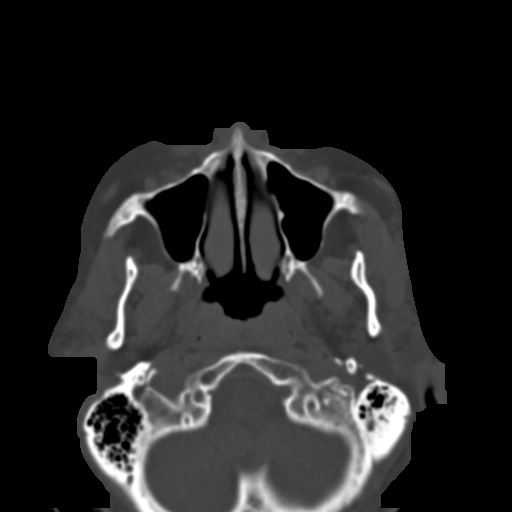
[im 53/82  brain]
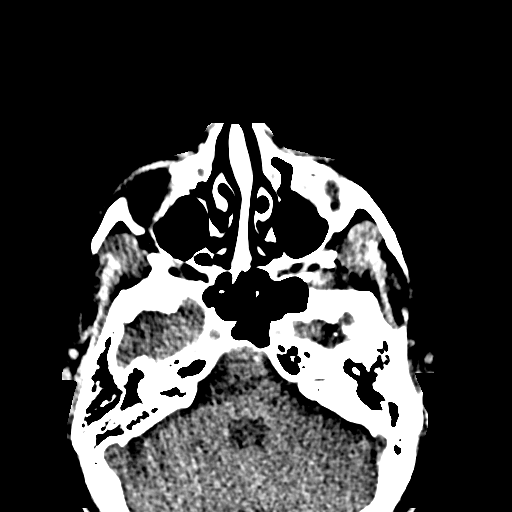
[im 53/82  bone]
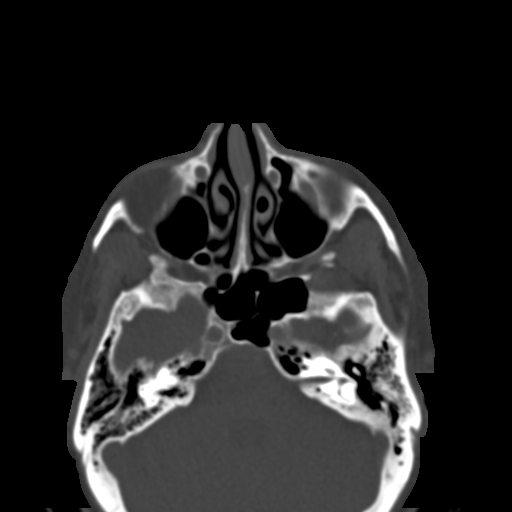
[im 64/82  bone]
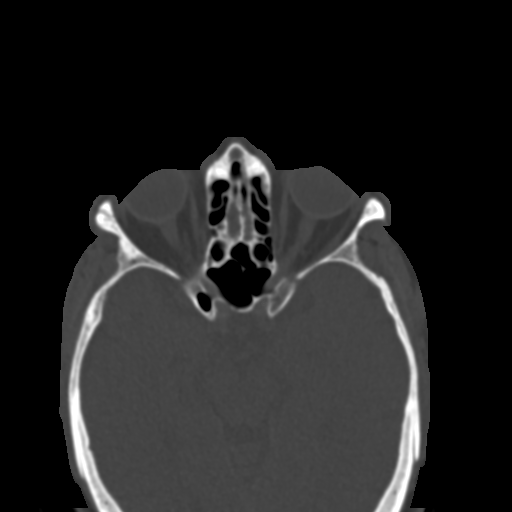
[im 76/82  bone]
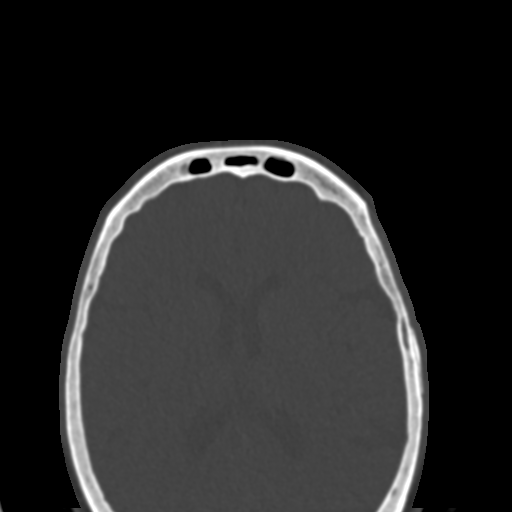

[Series 7: st cor · coronal · 0.33mm/px · 3 of 84 slices shown]
[im 21/84  bone]
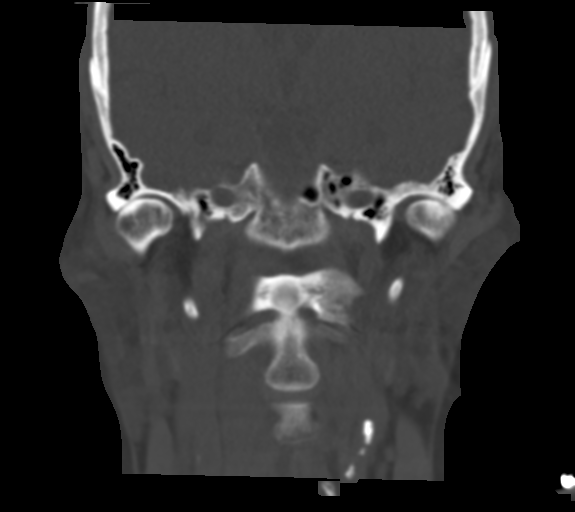
[im 42/84  bone]
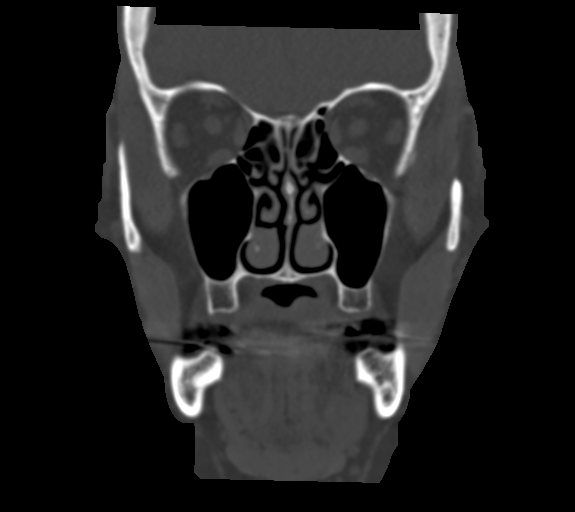
[im 63/84  bone]
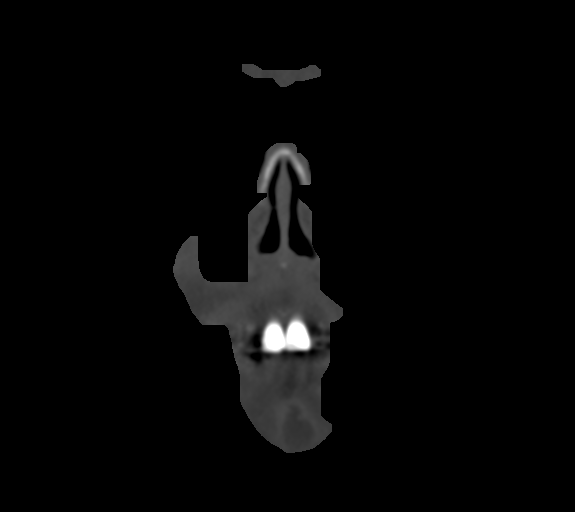

[Series 10: bone sag · sagittal · 0.34mm/px · 2 of 95 slices shown]
[im 32/95  bone]
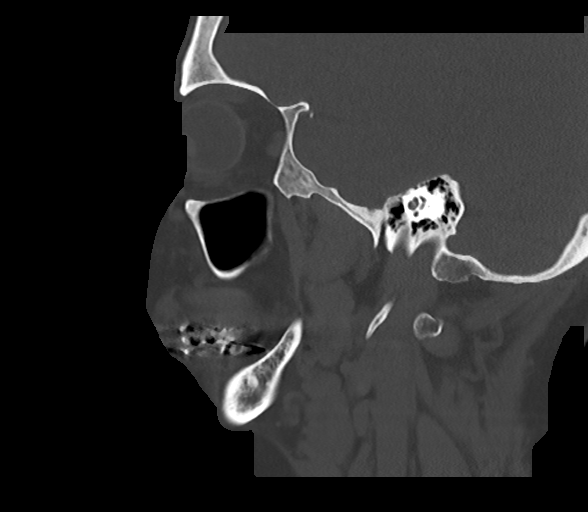
[im 63/95  bone]
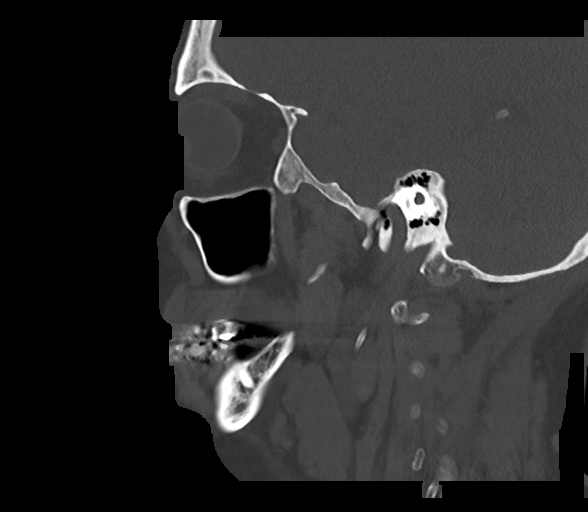

[12 of 47 positions shown; findings below may reference images not displayed]

Multidetector CT imaging of the maxillofacial structures was
performed. Multiplanar CT image reconstructions were also generated.
A small metallic BB was placed on the right temple in order to
reliably differentiate right from left.

Multidetector CT imaging of the cervical spine was performed without
intravenous contrast. Multiplanar CT image reconstructions were also
generated.

Multidetector CT imaging of the chest, abdomen and pelvis was
performed following the standard protocol during bolus
administration of intravenous contrast.

RADIATION DOSE REDUCTION: This exam was performed according to the
departmental dose-optimization program which includes automated
exposure control, adjustment of the mA and/or kV according to
patient size and/or use of iterative reconstruction technique.

CONTRAST:  100mL OMNIPAQUE IOHEXOL 300 MG/ML  SOLN
FINDINGS: CT HEAD FINDINGS

Brain, extra-axial spaces: No evidence of acute infarction,
hemorrhage, hydrocephalus, extra-axial collection or mass
lesion/mass effect. There is an 8.6 mm extra-axial chronic calcified
meningioma in the parasagittal superior right frontal area, and on
the left, a 9.5 mm extra-axial calcified meningioma along the high
parietal convexity. There are few benign dural calcifications along
the falx.

Vascular: No hyperdense vessel or unexpected calcification.

Skull: Normal. Negative for fracture or focal lesion. There is no
visible scalp hematoma.

Other: None.

CT MAXILLOFACIAL FINDINGS

Osseous: No fracture or mandibular dislocation. No destructive
process. Bony nasal septum deviates to the left. No visible dental
fracture.

Orbits: Negative. No traumatic or inflammatory finding.

Sinuses and mastoid aeration: There is mild scattered membrane
thickening in the ethmoids. Remaining paranasal sinuses are clear.
There is mild nasal membrane thickening. The ostiomeatal complexes
are both patent. The nasal turbinates are intact. The right mastoid
aeration is normal. On the left there is patchy fluid in the lower
mastoid air cells. Left mastoid air cells are otherwise clear.

Soft tissues: There is swelling in the chin. No other focal swelling
is seen.

CT CERVICAL SPINE FINDINGS

Alignment: Within normal limits.

Skull base and vertebrae: There is mild osteopenia without evidence
of fractures. There are solid fusions C3-6, with posterior bone
grafting with mature appearance and with spinous process cerclage
wiring C4-6. There is no plating hardware. There are normal disc
heights at C2-3 and C6-7, moderate disc space loss and Schmorl's
node formation at C7-T1.

Soft tissues and spinal canal: No prevertebral fluid or swelling. No
visible canal hematoma. There are mild calcifications in the
proximal cervical ICAs. There is an 8.5 mm low-density nodule in the
right lobe of the thyroid. There is no laryngeal mass.

Disc levels: As above, solid fusions noted across the disc spaces of
C3-6, posterior bone grafting and spinous process cerclage wiring
described above. There is mature fusion of the facet joints and bone
grafting. No herniated disc or cord compromise is seen.

Disc space loss at C7-T1 and small bidirectional osteophytes present
without spinal canal stenosis with uncinate spurring causing mild
foraminal stenosis of the S1 level. Other foramina are clear.

Other: None.

CT CHEST FINDINGS

Cardiovascular: The cardiac size is normal. There is no pericardial
effusion. There is homogeneous aortic enhancement without aneurysm
or dissection with scattered calcifications in the descending
segment. Great vessels are normal. Pulmonary arteries are normal
caliber and centrally clear. The pulmonary veins are decompressed.

Mediastinum/Nodes: 8.5 mm nodule right lobe of thyroid. Otherwise
unremarkable thyroid. No intrathoracic or axillary adenopathy or
focal esophageal thickening. Small hiatal hernia. The trachea is
clear. The central airways are clear.

Lungs/Pleura: The lungs clear of infiltrates and contusions. There
is no pleural effusion, thickening or pneumothorax. There is
scattered linear scarring or atelectasis in the lung bases. No
nodules are seen.

Musculoskeletal: No regional skeletal fracture is evident.
Degenerative disc disease and spondylosis thoracic spine. Mild
osteopenia. No chest wall mass or contusion. The left wrist is in
the field demonstrated an anteriorly displaced comminuted
intra-articular distal radial fracture.

CT ABDOMEN AND PELVIS FINDINGS

Hepatobiliary: No hepatic injury or perihepatic hematoma.
Gallbladder is unremarkable. There is no mass enhancement. Mild
hepatic steatosis. No biliary dilatation.

Pancreas: No mass enhancement.  No ductal dilatation.

Spleen: No splenic injury or perisplenic hematoma.

Adrenals/Urinary Tract: No adrenal hemorrhage or renal injury
identified. Bladder is unremarkable.

Stomach/Bowel: Small hiatal hernia. No dilatation or wall thickening
including the appendix. Moderate stool retention. There are colonic
diverticula without evidence of focal colitis or diverticulitis.

Vascular/Lymphatic: Minimal aortic atherosclerosis. No adenopathy.
Normal portal vein caliber.

Reproductive: The uterus is absent. No adnexal mass is seen. There
are pelvic phleboliths.

Other: No free air, hemorrhage or fluid.  No incarcerated hernias.

Musculoskeletal: Osteopenia and degenerative change lower lumbar
spine. No regional skeletal fracture is evident. There are bone
graft harvest changes in the right ilium.
IMPRESSION: 1. No acute intracranial CT findings or depressed skull fractures.
Small left mastoid effusion.
2. 2 small calcified meningiomas, 1 on each side.
3. Osteopenia, degenerative and postsurgical change in the cervical
spine without evidence of fractures with degenerative changes of the
thoracic and lumbar spine without evidence fractures.
4. 8.5 mm nodule in the right lobe of the thyroid gland. No imaging
follow-up is recommended.
5. No acute trauma related findings in the chest, abdomen or pelvis.
6. Aortic atherosclerosis.
7. Constipation and diverticulosis.
8. Intra-articular left distal radius fracture with anterior
displacement and comminution.

## 2022-12-08 IMAGING — CT CT CHEST-ABD-PELV W/ CM
2 of 5 series · 12 of 36 positions shown, 14 images · IV contrast (agent unspecified)
Comparison: None.

CLINICAL DATA: Blunt polytrauma.

EXAM:
CT HEAD WITHOUT CONTRAST
CT MAXILLOFACIAL WITHOUT CONTRAST
CT CERVICAL SPINE WITHOUT CONTRAST
CT CHEST, ABDOMEN AND PELVIS WITH CONTRAST
TECHNIQUE: Contiguous axial images were obtained from the base of the skull
through the vertex without intravenous contrast.

[Series 4: cap with · axial · 0.97mm/px · z∈[+428,+968]mm · 9 of 128 slices shown, 11 images]
[im 13/128  mediastinal]
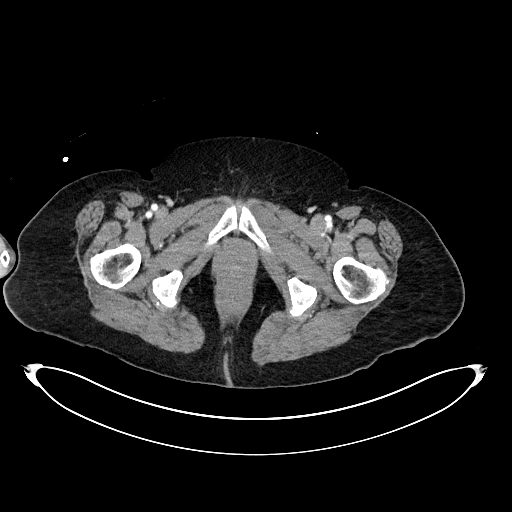
[im 13/128  bone]
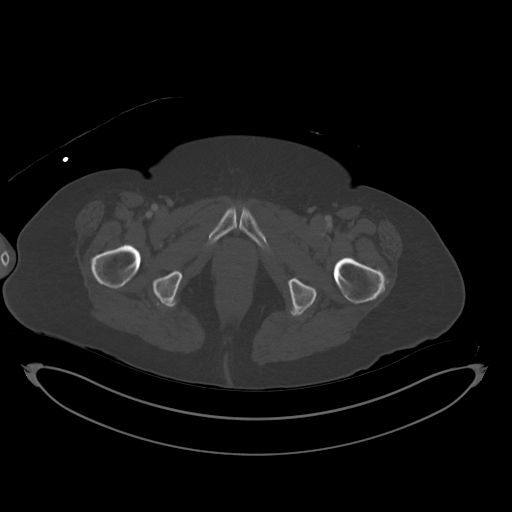
[im 26/128  mediastinal]
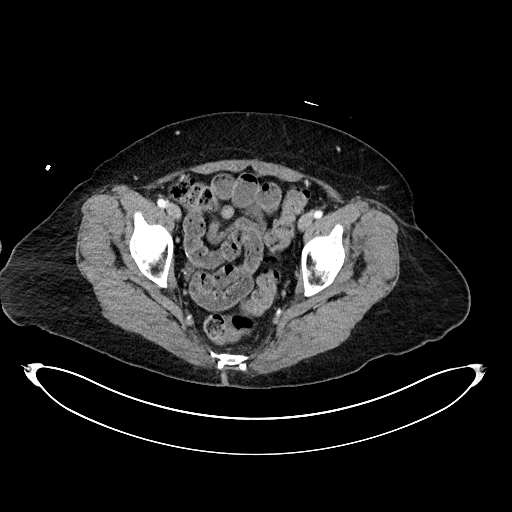
[im 39/128  mediastinal]
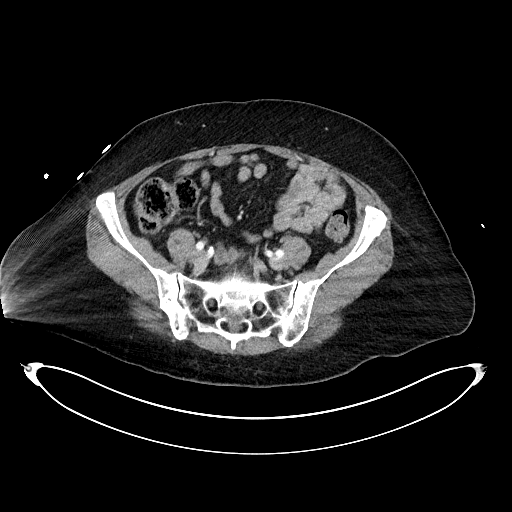
[im 51/128  mediastinal]
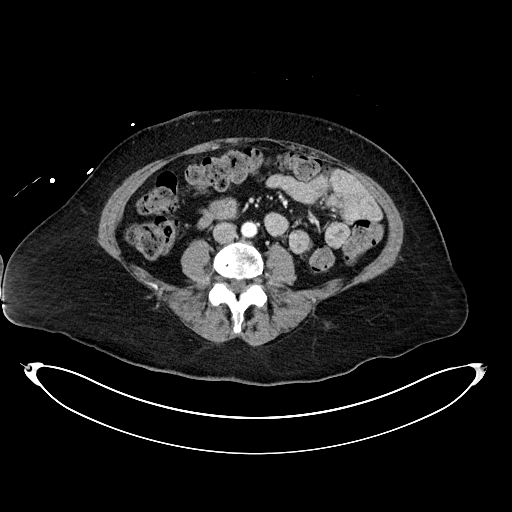
[im 64/128  mediastinal]
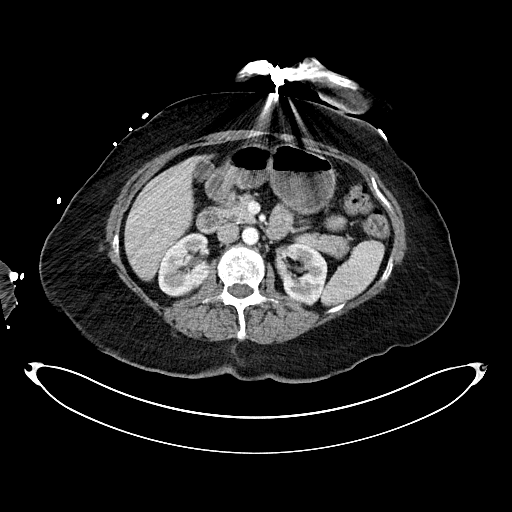
[im 77/128  mediastinal]
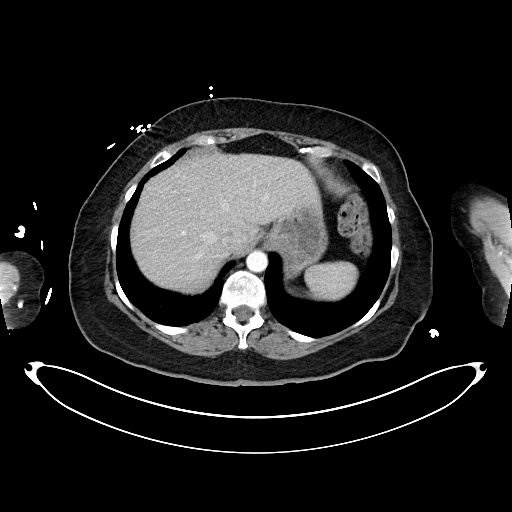
[im 89/128  mediastinal]
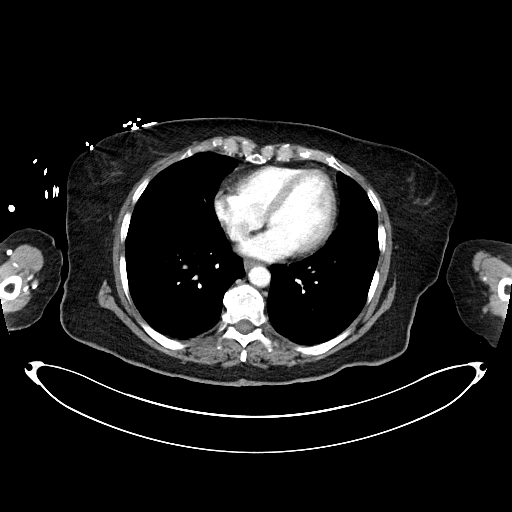
[im 102/128  mediastinal]
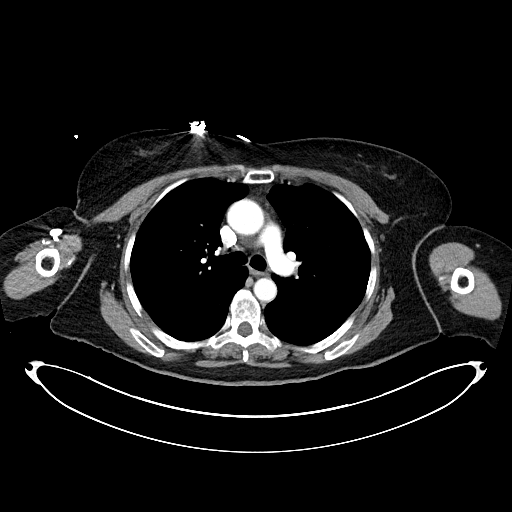
[im 115/128  mediastinal]
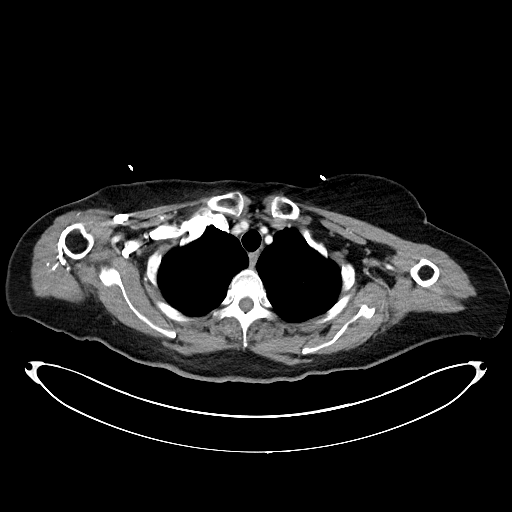
[im 115/128  bone]
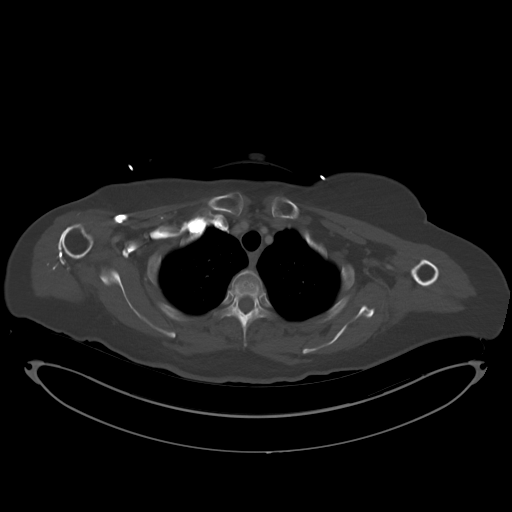

[Series 7: cor · coronal · 0.98mm/px · 3 of 117 slices shown]
[im 24/117  mediastinal]
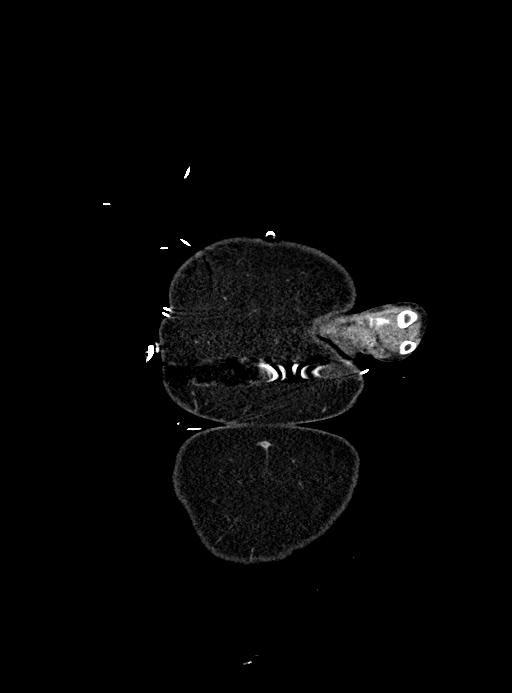
[im 47/117  mediastinal]
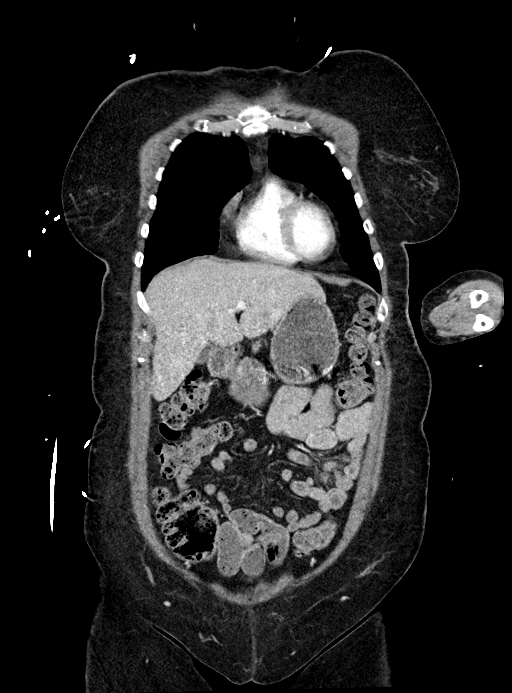
[im 70/117  mediastinal]
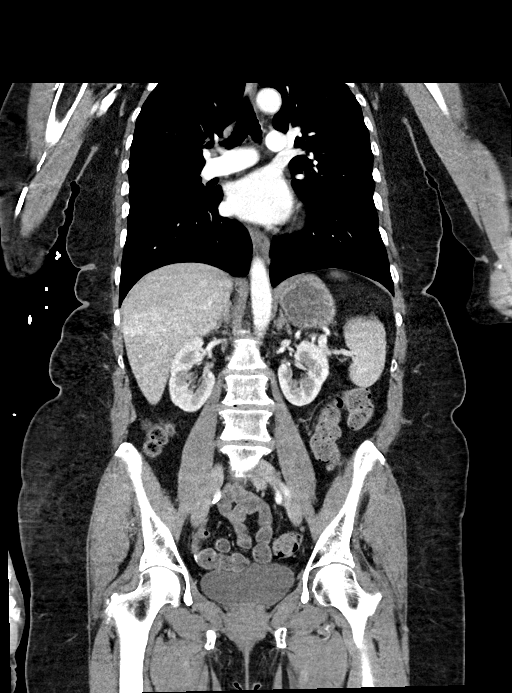

[12 of 36 positions shown; findings below may reference images not displayed]

Multidetector CT imaging of the maxillofacial structures was
performed. Multiplanar CT image reconstructions were also generated.
A small metallic BB was placed on the right temple in order to
reliably differentiate right from left.

Multidetector CT imaging of the cervical spine was performed without
intravenous contrast. Multiplanar CT image reconstructions were also
generated.

Multidetector CT imaging of the chest, abdomen and pelvis was
performed following the standard protocol during bolus
administration of intravenous contrast.

RADIATION DOSE REDUCTION: This exam was performed according to the
departmental dose-optimization program which includes automated
exposure control, adjustment of the mA and/or kV according to
patient size and/or use of iterative reconstruction technique.

CONTRAST:  100mL OMNIPAQUE IOHEXOL 300 MG/ML  SOLN
FINDINGS: CT HEAD FINDINGS

Brain, extra-axial spaces: No evidence of acute infarction,
hemorrhage, hydrocephalus, extra-axial collection or mass
lesion/mass effect. There is an 8.6 mm extra-axial chronic calcified
meningioma in the parasagittal superior right frontal area, and on
the left, a 9.5 mm extra-axial calcified meningioma along the high
parietal convexity. There are few benign dural calcifications along
the falx.

Vascular: No hyperdense vessel or unexpected calcification.

Skull: Normal. Negative for fracture or focal lesion. There is no
visible scalp hematoma.

Other: None.

CT MAXILLOFACIAL FINDINGS

Osseous: No fracture or mandibular dislocation. No destructive
process. Bony nasal septum deviates to the left. No visible dental
fracture.

Orbits: Negative. No traumatic or inflammatory finding.

Sinuses and mastoid aeration: There is mild scattered membrane
thickening in the ethmoids. Remaining paranasal sinuses are clear.
There is mild nasal membrane thickening. The ostiomeatal complexes
are both patent. The nasal turbinates are intact. The right mastoid
aeration is normal. On the left there is patchy fluid in the lower
mastoid air cells. Left mastoid air cells are otherwise clear.

Soft tissues: There is swelling in the chin. No other focal swelling
is seen.

CT CERVICAL SPINE FINDINGS

Alignment: Within normal limits.

Skull base and vertebrae: There is mild osteopenia without evidence
of fractures. There are solid fusions C3-6, with posterior bone
grafting with mature appearance and with spinous process cerclage
wiring C4-6. There is no plating hardware. There are normal disc
heights at C2-3 and C6-7, moderate disc space loss and Schmorl's
node formation at C7-T1.

Soft tissues and spinal canal: No prevertebral fluid or swelling. No
visible canal hematoma. There are mild calcifications in the
proximal cervical ICAs. There is an 8.5 mm low-density nodule in the
right lobe of the thyroid. There is no laryngeal mass.

Disc levels: As above, solid fusions noted across the disc spaces of
C3-6, posterior bone grafting and spinous process cerclage wiring
described above. There is mature fusion of the facet joints and bone
grafting. No herniated disc or cord compromise is seen.

Disc space loss at C7-T1 and small bidirectional osteophytes present
without spinal canal stenosis with uncinate spurring causing mild
foraminal stenosis of the S1 level. Other foramina are clear.

Other: None.

CT CHEST FINDINGS

Cardiovascular: The cardiac size is normal. There is no pericardial
effusion. There is homogeneous aortic enhancement without aneurysm
or dissection with scattered calcifications in the descending
segment. Great vessels are normal. Pulmonary arteries are normal
caliber and centrally clear. The pulmonary veins are decompressed.

Mediastinum/Nodes: 8.5 mm nodule right lobe of thyroid. Otherwise
unremarkable thyroid. No intrathoracic or axillary adenopathy or
focal esophageal thickening. Small hiatal hernia. The trachea is
clear. The central airways are clear.

Lungs/Pleura: The lungs clear of infiltrates and contusions. There
is no pleural effusion, thickening or pneumothorax. There is
scattered linear scarring or atelectasis in the lung bases. No
nodules are seen.

Musculoskeletal: No regional skeletal fracture is evident.
Degenerative disc disease and spondylosis thoracic spine. Mild
osteopenia. No chest wall mass or contusion. The left wrist is in
the field demonstrated an anteriorly displaced comminuted
intra-articular distal radial fracture.

CT ABDOMEN AND PELVIS FINDINGS

Hepatobiliary: No hepatic injury or perihepatic hematoma.
Gallbladder is unremarkable. There is no mass enhancement. Mild
hepatic steatosis. No biliary dilatation.

Pancreas: No mass enhancement.  No ductal dilatation.

Spleen: No splenic injury or perisplenic hematoma.

Adrenals/Urinary Tract: No adrenal hemorrhage or renal injury
identified. Bladder is unremarkable.

Stomach/Bowel: Small hiatal hernia. No dilatation or wall thickening
including the appendix. Moderate stool retention. There are colonic
diverticula without evidence of focal colitis or diverticulitis.

Vascular/Lymphatic: Minimal aortic atherosclerosis. No adenopathy.
Normal portal vein caliber.

Reproductive: The uterus is absent. No adnexal mass is seen. There
are pelvic phleboliths.

Other: No free air, hemorrhage or fluid.  No incarcerated hernias.

Musculoskeletal: Osteopenia and degenerative change lower lumbar
spine. No regional skeletal fracture is evident. There are bone
graft harvest changes in the right ilium.
IMPRESSION: 1. No acute intracranial CT findings or depressed skull fractures.
Small left mastoid effusion.
2. 2 small calcified meningiomas, 1 on each side.
3. Osteopenia, degenerative and postsurgical change in the cervical
spine without evidence of fractures with degenerative changes of the
thoracic and lumbar spine without evidence fractures.
4. 8.5 mm nodule in the right lobe of the thyroid gland. No imaging
follow-up is recommended.
5. No acute trauma related findings in the chest, abdomen or pelvis.
6. Aortic atherosclerosis.
7. Constipation and diverticulosis.
8. Intra-articular left distal radius fracture with anterior
displacement and comminution.

## 2022-12-08 IMAGING — DX DG WRIST COMPLETE 3+V*L*
1 series · 3 of 3 positions shown · non-contrast
Comparison: 12/12/2021 at 7681 hours

CLINICAL DATA: Post left wrist reduction

EXAM:
LEFT WRIST - COMPLETE 3+ VIEW

[Series 1: wrist · 0.14mm/px · 3 of 3 slices shown]
[im 1/3]
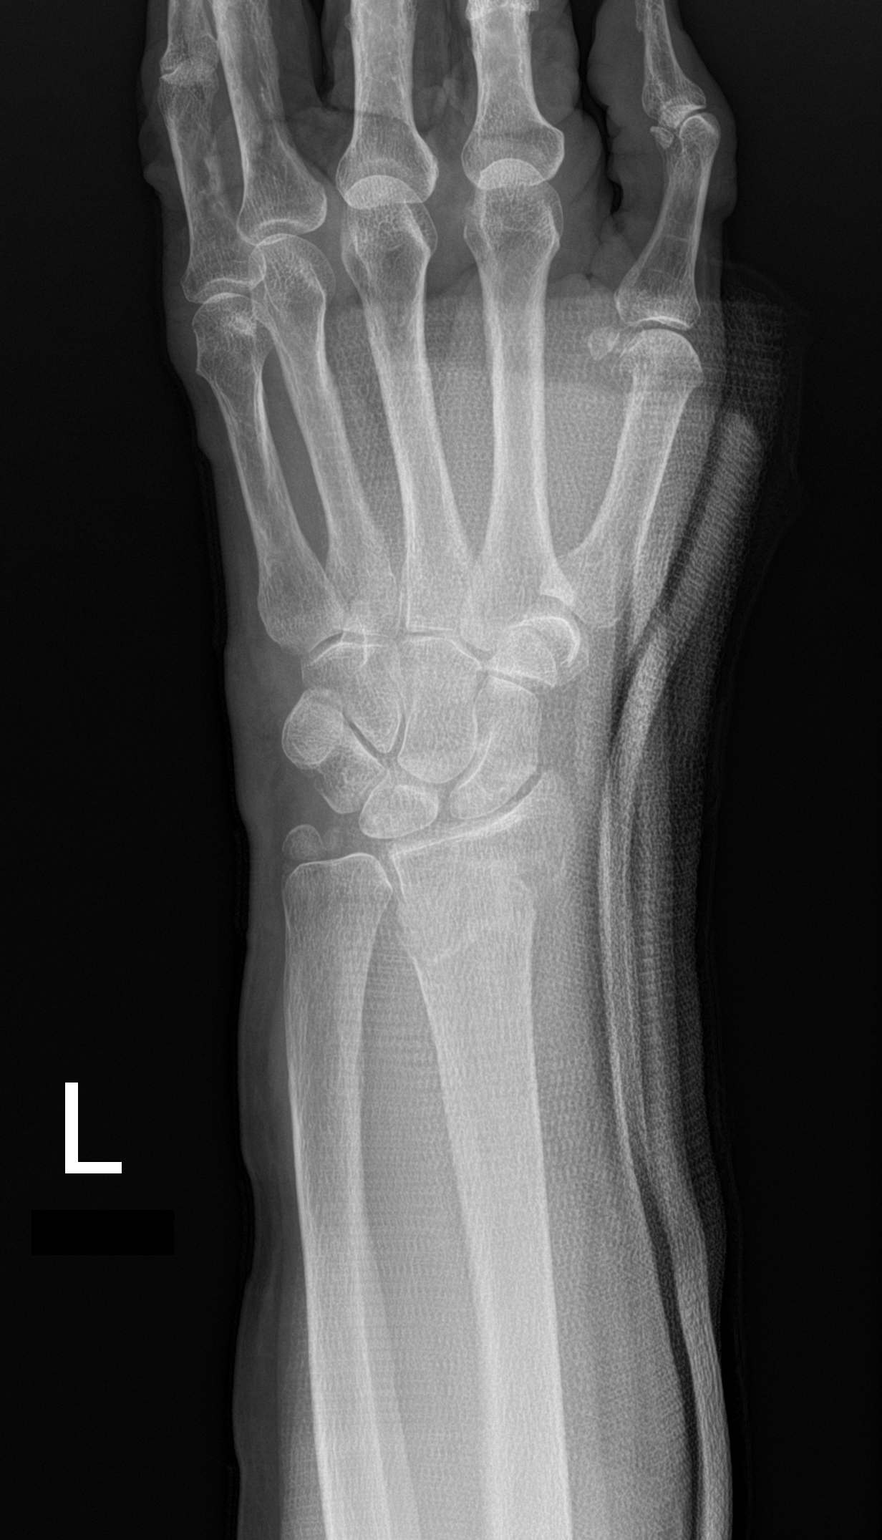
[im 2/3]
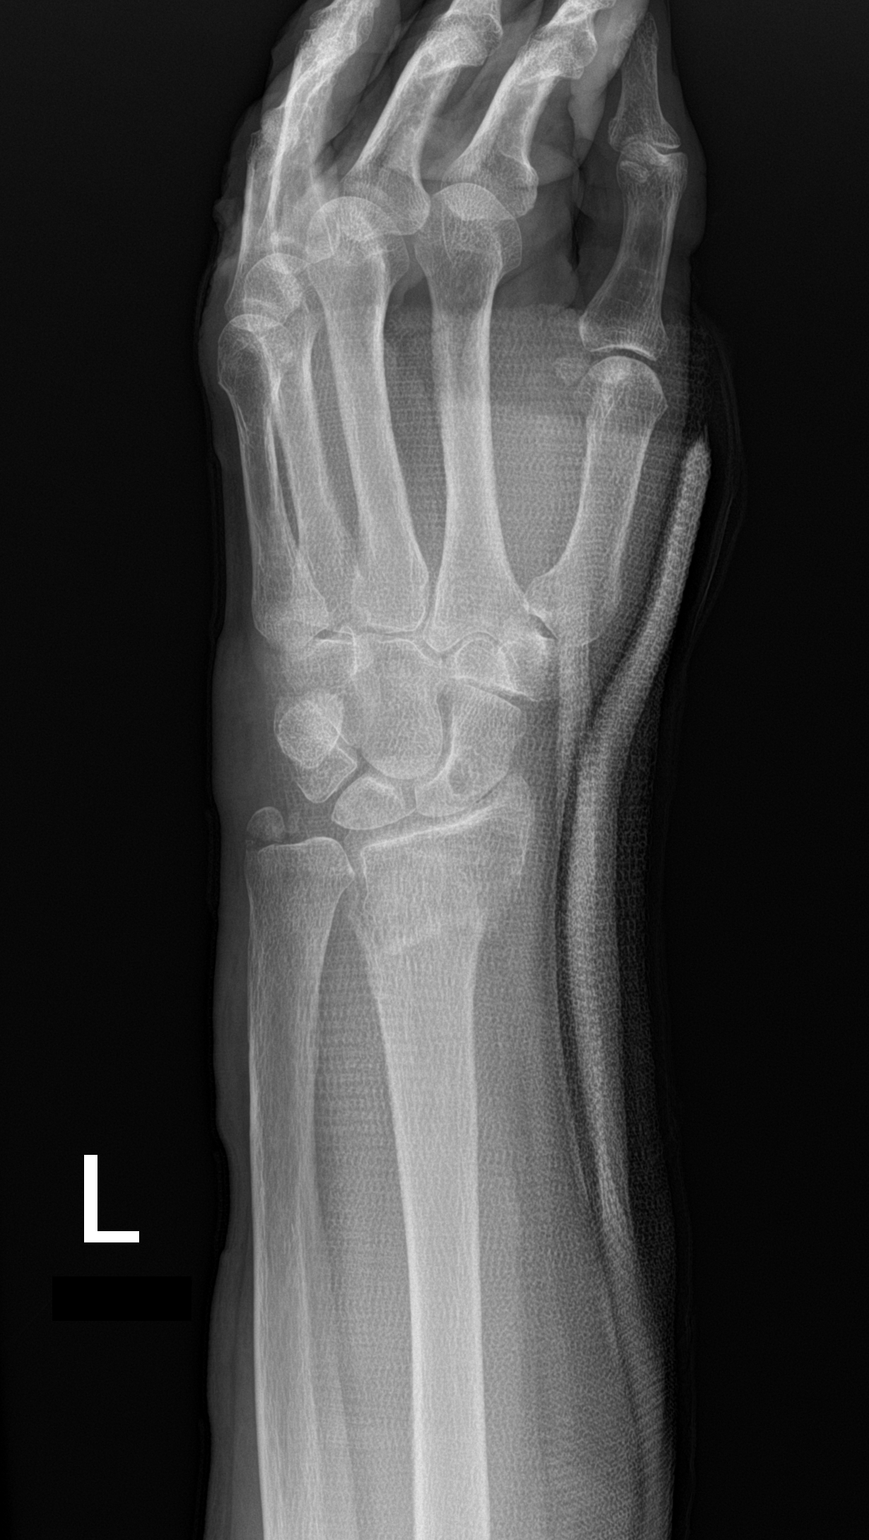
[im 3/3]
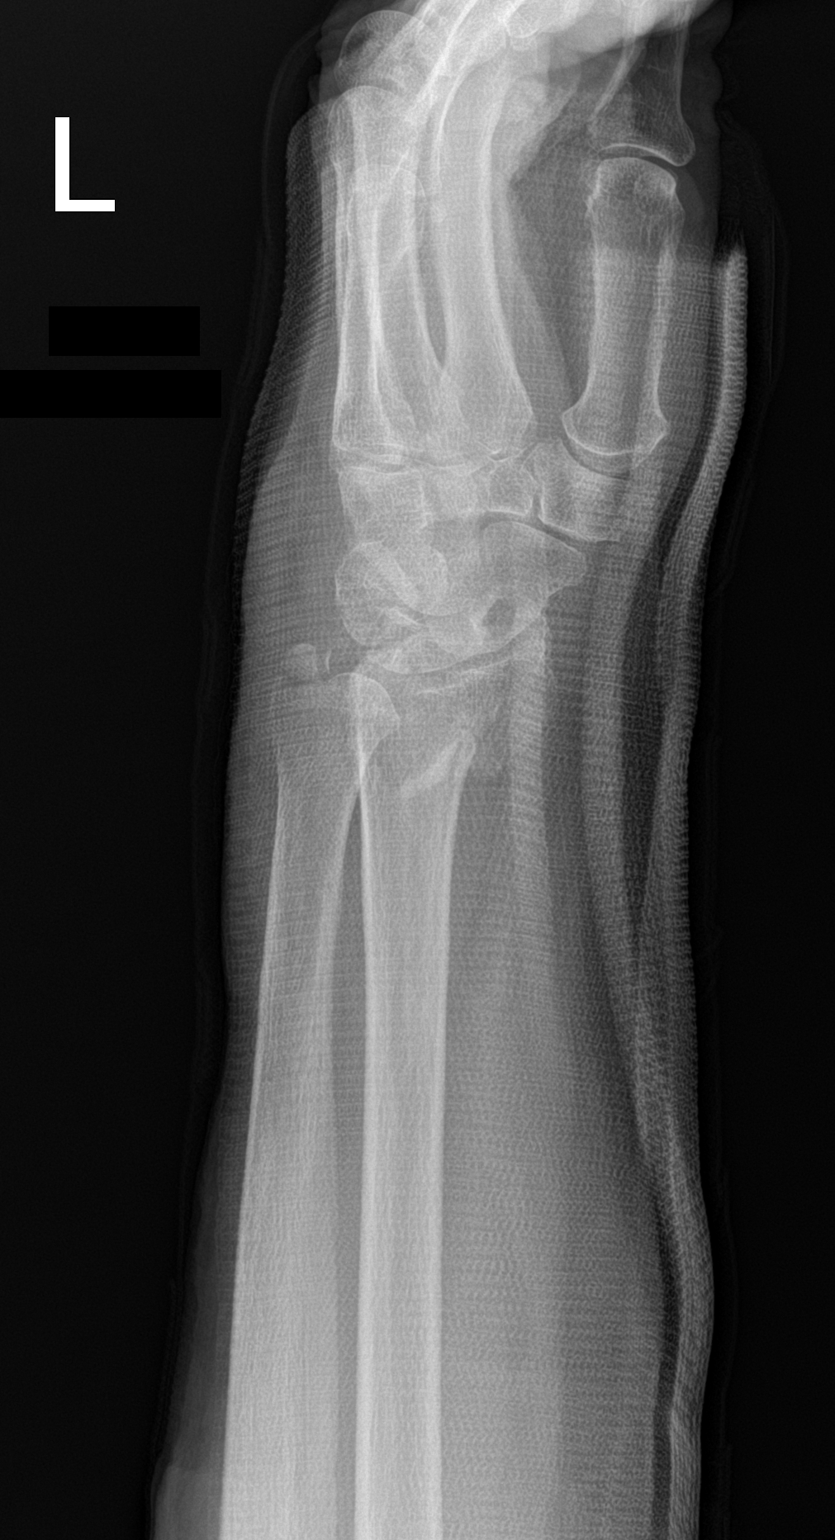

[3 of 3 positions shown; findings below may reference images not displayed]

FINDINGS: Limited due to patient positioning. Lateral view is obliqued.
Overlying cast obscures fine osseous detail.

Distal radial fracture with improved alignment. Nondisplaced ulnar
styloid fracture. Associated soft tissue swelling.
IMPRESSION: Limited evaluation, as above.

Distal radial fracture with improved alignment. Nondisplaced ulnar
styloid fracture.

## 2023-07-28 ENCOUNTER — Other Ambulatory Visit: Payer: Self-pay | Admitting: Family Medicine

## 2023-07-28 DIAGNOSIS — Z1231 Encounter for screening mammogram for malignant neoplasm of breast: Secondary | ICD-10-CM

## 2023-08-27 ENCOUNTER — Ambulatory Visit
Admission: RE | Admit: 2023-08-27 | Discharge: 2023-08-27 | Disposition: A | Discharge status: Home / Self Care | Payer: Medicare PPO | Source: Ambulatory Visit | Attending: Family Medicine | Admitting: Family Medicine

## 2023-08-27 DIAGNOSIS — Z1231 Encounter for screening mammogram for malignant neoplasm of breast: Secondary | ICD-10-CM

## 2024-08-07 ENCOUNTER — Other Ambulatory Visit: Payer: Self-pay | Admitting: Family Medicine

## 2024-08-07 DIAGNOSIS — Z1231 Encounter for screening mammogram for malignant neoplasm of breast: Secondary | ICD-10-CM

## 2024-08-31 ENCOUNTER — Ambulatory Visit
Admission: RE | Admit: 2024-08-31 | Discharge: 2024-08-31 | Disposition: A | Discharge status: Home / Self Care | Source: Ambulatory Visit | Attending: Family Medicine | Admitting: Family Medicine

## 2024-08-31 DIAGNOSIS — Z1231 Encounter for screening mammogram for malignant neoplasm of breast: Secondary | ICD-10-CM
